# Patient Record
Sex: Female | Born: 1950
Health system: Southern US, Community
[De-identification: ages and names within clinical notes are randomized; demographics above are authoritative.]

## PROBLEM LIST (undated history)

## (undated) DIAGNOSIS — E119 Type 2 diabetes mellitus without complications: Secondary | ICD-10-CM

## (undated) DIAGNOSIS — K219 Gastro-esophageal reflux disease without esophagitis: Secondary | ICD-10-CM

## (undated) DIAGNOSIS — I10 Essential (primary) hypertension: Secondary | ICD-10-CM

## (undated) DIAGNOSIS — I639 Cerebral infarction, unspecified: Secondary | ICD-10-CM

## (undated) DIAGNOSIS — N182 Chronic kidney disease, stage 2 (mild): Secondary | ICD-10-CM

## (undated) DIAGNOSIS — E782 Mixed hyperlipidemia: Secondary | ICD-10-CM

## (undated) DIAGNOSIS — J45909 Unspecified asthma, uncomplicated: Secondary | ICD-10-CM

## (undated) DIAGNOSIS — Z8673 Personal history of transient ischemic attack (TIA), and cerebral infarction without residual deficits: Secondary | ICD-10-CM

## (undated) HISTORY — DX: Personal history of transient ischemic attack (TIA), and cerebral infarction without residual deficits: Z86.73

## (undated) HISTORY — DX: Essential (primary) hypertension: I10

## (undated) HISTORY — DX: Gastro-esophageal reflux disease without esophagitis: K21.9

## (undated) HISTORY — DX: Type 2 diabetes mellitus without complications: E11.9

## (undated) HISTORY — PX: ABDOMINAL HYSTERECTOMY: SUR658

## (undated) HISTORY — DX: Mixed hyperlipidemia: E78.2

## (undated) HISTORY — PX: TONSILLECTOMY: SUR1361

## (undated) HISTORY — DX: Chronic kidney disease, stage 2 (mild): N18.2

---

## 2005-08-04 ENCOUNTER — Encounter (INDEPENDENT_AMBULATORY_CARE_PROVIDER_SITE_OTHER): Payer: Self-pay | Admitting: Internal Medicine

## 2007-02-27 ENCOUNTER — Emergency Department (HOSPITAL_COMMUNITY): Admission: EM | Admit: 2007-02-27 | Discharge: 2007-02-27 | Payer: Self-pay | Admitting: Emergency Medicine

## 2007-02-27 LAB — CONVERTED CEMR LAB
BUN: 16 mg/dL
Eosinophils Relative: 4 %
Hemoglobin: 11.8 g/dL
Monocytes Relative: 6 %
Potassium: 4.1 meq/L
RBC: 4.19 M/uL
Sodium: 138 meq/L

## 2007-07-18 ENCOUNTER — Encounter (INDEPENDENT_AMBULATORY_CARE_PROVIDER_SITE_OTHER): Payer: Self-pay | Admitting: Internal Medicine

## 2007-07-18 ENCOUNTER — Ambulatory Visit (HOSPITAL_COMMUNITY): Admission: RE | Admit: 2007-07-18 | Discharge: 2007-07-18 | Payer: Self-pay | Admitting: Internal Medicine

## 2007-07-18 ENCOUNTER — Ambulatory Visit: Payer: Self-pay | Admitting: Internal Medicine

## 2007-07-18 ENCOUNTER — Other Ambulatory Visit: Admission: RE | Admit: 2007-07-18 | Discharge: 2007-07-18 | Payer: Self-pay | Admitting: Internal Medicine

## 2007-07-18 DIAGNOSIS — M129 Arthropathy, unspecified: Secondary | ICD-10-CM | POA: Insufficient documentation

## 2007-07-18 DIAGNOSIS — J45909 Unspecified asthma, uncomplicated: Secondary | ICD-10-CM | POA: Insufficient documentation

## 2007-07-18 DIAGNOSIS — M25569 Pain in unspecified knee: Secondary | ICD-10-CM | POA: Insufficient documentation

## 2007-07-18 DIAGNOSIS — I10 Essential (primary) hypertension: Secondary | ICD-10-CM | POA: Insufficient documentation

## 2007-07-18 LAB — CONVERTED CEMR LAB
Glucose, Urine, Semiquant: NEGATIVE
OCCULT 1: NEGATIVE
pH: 5

## 2007-07-19 ENCOUNTER — Telehealth (INDEPENDENT_AMBULATORY_CARE_PROVIDER_SITE_OTHER): Payer: Self-pay | Admitting: *Deleted

## 2007-07-19 ENCOUNTER — Encounter (INDEPENDENT_AMBULATORY_CARE_PROVIDER_SITE_OTHER): Payer: Self-pay | Admitting: Internal Medicine

## 2007-07-21 ENCOUNTER — Encounter (INDEPENDENT_AMBULATORY_CARE_PROVIDER_SITE_OTHER): Payer: Self-pay | Admitting: Internal Medicine

## 2007-07-22 ENCOUNTER — Encounter (INDEPENDENT_AMBULATORY_CARE_PROVIDER_SITE_OTHER): Payer: Self-pay | Admitting: Internal Medicine

## 2007-07-25 ENCOUNTER — Telehealth (INDEPENDENT_AMBULATORY_CARE_PROVIDER_SITE_OTHER): Payer: Self-pay | Admitting: *Deleted

## 2007-07-26 ENCOUNTER — Encounter (INDEPENDENT_AMBULATORY_CARE_PROVIDER_SITE_OTHER): Payer: Self-pay | Admitting: Internal Medicine

## 2007-08-01 ENCOUNTER — Ambulatory Visit: Payer: Self-pay | Admitting: Internal Medicine

## 2007-08-01 DIAGNOSIS — R87610 Atypical squamous cells of undetermined significance on cytologic smear of cervix (ASC-US): Secondary | ICD-10-CM

## 2007-08-01 LAB — CONVERTED CEMR LAB: Hgb A1c MFr Bld: 7.6 %

## 2007-08-02 ENCOUNTER — Encounter (INDEPENDENT_AMBULATORY_CARE_PROVIDER_SITE_OTHER): Payer: Self-pay | Admitting: Internal Medicine

## 2007-08-02 ENCOUNTER — Telehealth (INDEPENDENT_AMBULATORY_CARE_PROVIDER_SITE_OTHER): Payer: Self-pay | Admitting: *Deleted

## 2007-08-02 LAB — CONVERTED CEMR LAB
Albumin: 4.2 g/dL (ref 3.5–5.2)
CO2: 21 meq/L (ref 19–32)
Calcium: 9.7 mg/dL (ref 8.4–10.5)
Chloride: 105 meq/L (ref 96–112)
Cholesterol: 146 mg/dL (ref 0–200)
Eosinophils Relative: 5 % (ref 0–5)
Glucose, Bld: 83 mg/dL (ref 70–99)
HCT: 36.3 % (ref 36.0–46.0)
Hemoglobin: 11.4 g/dL — ABNORMAL LOW (ref 12.0–15.0)
Lymphocytes Relative: 45 % (ref 12–46)
Lymphs Abs: 3.5 10*3/uL (ref 0.7–4.0)
Neutro Abs: 3.6 10*3/uL (ref 1.7–7.7)
Platelets: 362 10*3/uL (ref 150–400)
Sodium: 142 meq/L (ref 135–145)
Total Bilirubin: 0.2 mg/dL — ABNORMAL LOW (ref 0.3–1.2)
Total Protein: 7.8 g/dL (ref 6.0–8.3)
Triglycerides: 240 mg/dL — ABNORMAL HIGH (ref <150)
WBC: 7.8 10*3/uL (ref 4.0–10.5)

## 2007-08-04 ENCOUNTER — Encounter (INDEPENDENT_AMBULATORY_CARE_PROVIDER_SITE_OTHER): Payer: Self-pay | Admitting: Internal Medicine

## 2007-08-11 ENCOUNTER — Encounter (INDEPENDENT_AMBULATORY_CARE_PROVIDER_SITE_OTHER): Payer: Self-pay | Admitting: Internal Medicine

## 2007-08-23 ENCOUNTER — Telehealth (INDEPENDENT_AMBULATORY_CARE_PROVIDER_SITE_OTHER): Payer: Self-pay | Admitting: *Deleted

## 2007-09-22 ENCOUNTER — Ambulatory Visit: Payer: Self-pay | Admitting: Internal Medicine

## 2007-09-22 LAB — CONVERTED CEMR LAB
Bilirubin Urine: NEGATIVE
Blood in Urine, dipstick: NEGATIVE
Ketones, urine, test strip: NEGATIVE
Nitrite: NEGATIVE
Protein, U semiquant: NEGATIVE
Urobilinogen, UA: 0.2

## 2007-09-23 ENCOUNTER — Ambulatory Visit (HOSPITAL_COMMUNITY): Admission: RE | Admit: 2007-09-23 | Discharge: 2007-09-23 | Payer: Self-pay | Admitting: Internal Medicine

## 2007-09-26 ENCOUNTER — Telehealth (INDEPENDENT_AMBULATORY_CARE_PROVIDER_SITE_OTHER): Payer: Self-pay | Admitting: Internal Medicine

## 2007-09-26 ENCOUNTER — Encounter (INDEPENDENT_AMBULATORY_CARE_PROVIDER_SITE_OTHER): Payer: Self-pay | Admitting: Internal Medicine

## 2007-10-17 ENCOUNTER — Encounter (INDEPENDENT_AMBULATORY_CARE_PROVIDER_SITE_OTHER): Payer: Self-pay | Admitting: Internal Medicine

## 2007-10-17 ENCOUNTER — Other Ambulatory Visit: Admission: RE | Admit: 2007-10-17 | Discharge: 2007-10-17 | Payer: Self-pay

## 2007-10-17 ENCOUNTER — Ambulatory Visit: Payer: Self-pay | Admitting: Internal Medicine

## 2007-10-17 LAB — CONVERTED CEMR LAB
Bilirubin Urine: NEGATIVE
Blood in Urine, dipstick: NEGATIVE
Glucose, Urine, Semiquant: NEGATIVE
Nitrite: NEGATIVE
Specific Gravity, Urine: 1.02

## 2007-10-18 ENCOUNTER — Telehealth (INDEPENDENT_AMBULATORY_CARE_PROVIDER_SITE_OTHER): Payer: Self-pay | Admitting: *Deleted

## 2007-10-18 ENCOUNTER — Encounter (INDEPENDENT_AMBULATORY_CARE_PROVIDER_SITE_OTHER): Payer: Self-pay | Admitting: Internal Medicine

## 2007-10-18 LAB — CONVERTED CEMR LAB
AST: 13 units/L (ref 0–37)
Albumin: 4 g/dL (ref 3.5–5.2)
Alkaline Phosphatase: 71 units/L (ref 39–117)
BUN: 18 mg/dL (ref 6–23)
Basophils Relative: 1 % (ref 0–1)
Creatinine, Ser: 0.98 mg/dL (ref 0.40–1.20)
Eosinophils Absolute: 0.4 10*3/uL (ref 0.0–0.7)
MCHC: 32.6 g/dL (ref 30.0–36.0)
MCV: 84.7 fL (ref 78.0–100.0)
Monocytes Relative: 4 % (ref 3–12)
Neutrophils Relative %: 49 % (ref 43–77)
Potassium: 3.9 meq/L (ref 3.5–5.3)
RBC: 4.31 M/uL (ref 3.87–5.11)

## 2007-10-25 ENCOUNTER — Telehealth (INDEPENDENT_AMBULATORY_CARE_PROVIDER_SITE_OTHER): Payer: Self-pay | Admitting: *Deleted

## 2007-10-25 ENCOUNTER — Encounter (INDEPENDENT_AMBULATORY_CARE_PROVIDER_SITE_OTHER): Payer: Self-pay | Admitting: Internal Medicine

## 2007-10-25 LAB — CONVERTED CEMR LAB: Pap Smear: ABNORMAL

## 2007-11-03 ENCOUNTER — Encounter (INDEPENDENT_AMBULATORY_CARE_PROVIDER_SITE_OTHER): Payer: Self-pay | Admitting: Internal Medicine

## 2007-12-05 ENCOUNTER — Encounter (INDEPENDENT_AMBULATORY_CARE_PROVIDER_SITE_OTHER): Payer: Self-pay | Admitting: Internal Medicine

## 2007-12-26 ENCOUNTER — Telehealth (INDEPENDENT_AMBULATORY_CARE_PROVIDER_SITE_OTHER): Payer: Self-pay | Admitting: *Deleted

## 2008-01-03 ENCOUNTER — Ambulatory Visit: Payer: Self-pay | Admitting: Internal Medicine

## 2008-03-05 ENCOUNTER — Telehealth (INDEPENDENT_AMBULATORY_CARE_PROVIDER_SITE_OTHER): Payer: Self-pay | Admitting: *Deleted

## 2008-04-23 ENCOUNTER — Telehealth (INDEPENDENT_AMBULATORY_CARE_PROVIDER_SITE_OTHER): Payer: Self-pay | Admitting: Internal Medicine

## 2008-06-05 ENCOUNTER — Ambulatory Visit (HOSPITAL_COMMUNITY): Admission: RE | Admit: 2008-06-05 | Discharge: 2008-06-05 | Payer: Self-pay | Admitting: Internal Medicine

## 2008-06-05 ENCOUNTER — Ambulatory Visit: Payer: Self-pay | Admitting: Internal Medicine

## 2008-06-05 DIAGNOSIS — R209 Unspecified disturbances of skin sensation: Secondary | ICD-10-CM | POA: Insufficient documentation

## 2008-06-05 LAB — CONVERTED CEMR LAB
Blood Glucose, Fingerstick: 190
Hgb A1c MFr Bld: 8.5 %

## 2008-06-06 ENCOUNTER — Telehealth (INDEPENDENT_AMBULATORY_CARE_PROVIDER_SITE_OTHER): Payer: Self-pay | Admitting: Internal Medicine

## 2008-06-08 ENCOUNTER — Telehealth (INDEPENDENT_AMBULATORY_CARE_PROVIDER_SITE_OTHER): Payer: Self-pay | Admitting: *Deleted

## 2008-06-12 ENCOUNTER — Encounter (INDEPENDENT_AMBULATORY_CARE_PROVIDER_SITE_OTHER): Payer: Self-pay | Admitting: Internal Medicine

## 2008-06-21 ENCOUNTER — Ambulatory Visit: Payer: Self-pay | Admitting: Cardiology

## 2008-07-02 ENCOUNTER — Ambulatory Visit: Payer: Self-pay | Admitting: Internal Medicine

## 2008-07-16 ENCOUNTER — Telehealth (INDEPENDENT_AMBULATORY_CARE_PROVIDER_SITE_OTHER): Payer: Self-pay | Admitting: Internal Medicine

## 2008-07-18 ENCOUNTER — Ambulatory Visit: Payer: Self-pay | Admitting: Internal Medicine

## 2008-07-20 ENCOUNTER — Telehealth (INDEPENDENT_AMBULATORY_CARE_PROVIDER_SITE_OTHER): Payer: Self-pay | Admitting: *Deleted

## 2008-08-07 ENCOUNTER — Telehealth (INDEPENDENT_AMBULATORY_CARE_PROVIDER_SITE_OTHER): Payer: Self-pay | Admitting: *Deleted

## 2008-08-09 ENCOUNTER — Encounter (INDEPENDENT_AMBULATORY_CARE_PROVIDER_SITE_OTHER): Payer: Self-pay | Admitting: Internal Medicine

## 2008-08-30 ENCOUNTER — Ambulatory Visit: Payer: Self-pay | Admitting: Internal Medicine

## 2008-08-30 DIAGNOSIS — M25519 Pain in unspecified shoulder: Secondary | ICD-10-CM | POA: Insufficient documentation

## 2008-09-12 ENCOUNTER — Ambulatory Visit: Payer: Self-pay | Admitting: Internal Medicine

## 2008-09-12 DIAGNOSIS — E1159 Type 2 diabetes mellitus with other circulatory complications: Secondary | ICD-10-CM

## 2008-09-12 DIAGNOSIS — Z8673 Personal history of transient ischemic attack (TIA), and cerebral infarction without residual deficits: Secondary | ICD-10-CM | POA: Insufficient documentation

## 2008-09-12 DIAGNOSIS — I6350 Cerebral infarction due to unspecified occlusion or stenosis of unspecified cerebral artery: Secondary | ICD-10-CM | POA: Insufficient documentation

## 2008-09-12 LAB — CONVERTED CEMR LAB: Hgb A1c MFr Bld: 7.5 %

## 2008-09-13 ENCOUNTER — Encounter (INDEPENDENT_AMBULATORY_CARE_PROVIDER_SITE_OTHER): Payer: Self-pay | Admitting: Internal Medicine

## 2008-09-13 LAB — CONVERTED CEMR LAB
Calcium: 9.9 mg/dL (ref 8.4–10.5)
Cholesterol: 167 mg/dL (ref 0–200)
Glucose, Bld: 74 mg/dL (ref 70–99)
HDL: 30 mg/dL — ABNORMAL LOW (ref >39)
Microalb Creat Ratio: 2.9 mg/g (ref 0.0–30.0)
Microalb, Ur: 0.72 mg/dL (ref 0.00–1.89)
Sodium: 140 meq/L (ref 135–145)
Total CHOL/HDL Ratio: 5.6

## 2008-10-23 ENCOUNTER — Telehealth (INDEPENDENT_AMBULATORY_CARE_PROVIDER_SITE_OTHER): Payer: Self-pay | Admitting: Internal Medicine

## 2008-12-12 ENCOUNTER — Ambulatory Visit: Payer: Self-pay | Admitting: Internal Medicine

## 2008-12-12 LAB — CONVERTED CEMR LAB: Hgb A1c MFr Bld: 7.9 %

## 2008-12-31 ENCOUNTER — Encounter (INDEPENDENT_AMBULATORY_CARE_PROVIDER_SITE_OTHER): Payer: Self-pay | Admitting: Internal Medicine

## 2008-12-31 ENCOUNTER — Telehealth (INDEPENDENT_AMBULATORY_CARE_PROVIDER_SITE_OTHER): Payer: Self-pay | Admitting: *Deleted

## 2009-01-18 ENCOUNTER — Telehealth (INDEPENDENT_AMBULATORY_CARE_PROVIDER_SITE_OTHER): Payer: Self-pay | Admitting: *Deleted

## 2010-07-20 LAB — CONVERTED CEMR LAB
Alkaline Phosphatase: 60 units/L (ref 39–117)
BUN: 18 mg/dL (ref 6–23)
Blood Glucose, Fingerstick: 136
Creatinine, Ser: 0.97 mg/dL (ref 0.40–1.20)
Glucose, Bld: 87 mg/dL (ref 70–99)
Hemoglobin: 12.1 g/dL
Total Bilirubin: 0.3 mg/dL (ref 0.3–1.2)

## 2010-12-03 ENCOUNTER — Ambulatory Visit: Payer: Medicare Other | Attending: Neurology

## 2010-12-03 DIAGNOSIS — G4710 Hypersomnia, unspecified: Secondary | ICD-10-CM | POA: Insufficient documentation

## 2010-12-03 DIAGNOSIS — Z6836 Body mass index (BMI) 36.0-36.9, adult: Secondary | ICD-10-CM | POA: Insufficient documentation

## 2010-12-03 DIAGNOSIS — G4730 Sleep apnea, unspecified: Secondary | ICD-10-CM | POA: Insufficient documentation

## 2010-12-06 NOTE — Procedures (Signed)
Katherine Stokes, Katherine Stokes              ACCOUNT NO.:  [id number]  MEDICAL RECORD NO.:  [id number]          PATIENT TYPE:  OUT  LOCATION:  SLEEP LAB                     FACILITY:  [facility]  PHYSICIAN:  Bob Eastwood A. Merlene Laughter, M.D. DATE OF BIRTH:  08-10-1950  DATE OF STUDY:  12/03/2010                           NOCTURNAL POLYSOMNOGRAM  REFERRING PHYSICIAN:  Jasmeen Fritsch  REFERRING PHYSICIAN:  Kaye Luoma A. Merlene Laughter, MD  INDICATION FOR STUDY:  This is a 60 year old who presents with fatigue, hypersomnia, and snoring.  The study has been done to evaluate for obstructive sleep apnea syndrome.  EPWORTH SLEEPINESS SCORE: 1. BMI 36.  MEDICATIONS:  Plavix, metformin, benazepril, Norvasc, folic acid, Protonix, TriCor, and Glucotrol.  SLEEP ARCHITECTURE:  Architectural summary:  The total recording time is 401 minutes.  Sleep efficiency 75.8%, sleep latency 21.5 minutes, REM latency 62 minutes.  Stage N1 12.5%, N2 56.6%, N3 6.7%, and REM sleep 24.2%.  RESPIRATORY DATA:  Respiratory summary:  Baseline oxygen saturation is 99, lowest saturation 89 with REM sleep.  Diagnostic AHI 3 and RDI 7.  OXYGEN DATA:  CARDIAC DATA:  Electrocardiogram summary:  Average heart rate is 81 with no significant dysrhythmias observed.  MOVEMENT-PARASOMNIA:  Limb movement summary:  PLM index 0.  IMPRESSIONS-RECOMMENDATIONS:  Impression:  Unremarkable nocturnal polysomnography.     Alverna Fawley A. Merlene Laughter, M.D. Electronically Signed 12/07/2010 21:27:34   KAD/MEDQ  D:  12/05/2010 17:33:25  T:  12/06/2010 05:35:20  Job:  [id number]

## 2011-04-03 LAB — CBC
HCT: 35.9 — ABNORMAL LOW
Hemoglobin: 11.8 — ABNORMAL LOW
MCV: 85.8
Platelets: 330
WBC: 7.6

## 2011-04-03 LAB — DIFFERENTIAL
Eosinophils Absolute: 0.3
Eosinophils Relative: 4
Lymphs Abs: 3
Monocytes Absolute: 0.4
Monocytes Relative: 6

## 2011-04-03 LAB — BASIC METABOLIC PANEL
BUN: 16
Chloride: 107
Potassium: 4.1
Sodium: 138

## 2011-07-05 DIAGNOSIS — E119 Type 2 diabetes mellitus without complications: Secondary | ICD-10-CM | POA: Diagnosis not present

## 2011-07-05 DIAGNOSIS — Z8673 Personal history of transient ischemic attack (TIA), and cerebral infarction without residual deficits: Secondary | ICD-10-CM | POA: Diagnosis not present

## 2011-07-05 DIAGNOSIS — N390 Urinary tract infection, site not specified: Secondary | ICD-10-CM | POA: Diagnosis not present

## 2011-07-05 DIAGNOSIS — Z79899 Other long term (current) drug therapy: Secondary | ICD-10-CM | POA: Diagnosis not present

## 2011-07-05 DIAGNOSIS — Z794 Long term (current) use of insulin: Secondary | ICD-10-CM | POA: Diagnosis not present

## 2011-07-05 DIAGNOSIS — Z7902 Long term (current) use of antithrombotics/antiplatelets: Secondary | ICD-10-CM | POA: Diagnosis not present

## 2011-07-27 DIAGNOSIS — E782 Mixed hyperlipidemia: Secondary | ICD-10-CM | POA: Diagnosis not present

## 2011-07-27 DIAGNOSIS — I10 Essential (primary) hypertension: Secondary | ICD-10-CM | POA: Diagnosis not present

## 2011-07-27 DIAGNOSIS — G459 Transient cerebral ischemic attack, unspecified: Secondary | ICD-10-CM | POA: Diagnosis not present

## 2011-10-06 DIAGNOSIS — I6990 Unspecified sequelae of unspecified cerebrovascular disease: Secondary | ICD-10-CM | POA: Diagnosis not present

## 2011-10-06 DIAGNOSIS — R51 Headache: Secondary | ICD-10-CM | POA: Diagnosis not present

## 2011-10-06 DIAGNOSIS — G250 Essential tremor: Secondary | ICD-10-CM | POA: Diagnosis not present

## 2011-10-06 DIAGNOSIS — M62838 Other muscle spasm: Secondary | ICD-10-CM | POA: Diagnosis not present

## 2011-10-16 DIAGNOSIS — R569 Unspecified convulsions: Secondary | ICD-10-CM | POA: Diagnosis not present

## 2011-10-22 DIAGNOSIS — Z8673 Personal history of transient ischemic attack (TIA), and cerebral infarction without residual deficits: Secondary | ICD-10-CM | POA: Diagnosis not present

## 2011-10-22 DIAGNOSIS — Z87440 Personal history of urinary (tract) infections: Secondary | ICD-10-CM | POA: Diagnosis not present

## 2011-10-22 DIAGNOSIS — Z79899 Other long term (current) drug therapy: Secondary | ICD-10-CM | POA: Diagnosis not present

## 2011-10-22 DIAGNOSIS — Z794 Long term (current) use of insulin: Secondary | ICD-10-CM | POA: Diagnosis not present

## 2011-10-22 DIAGNOSIS — B373 Candidiasis of vulva and vagina: Secondary | ICD-10-CM | POA: Diagnosis not present

## 2011-10-22 DIAGNOSIS — R300 Dysuria: Secondary | ICD-10-CM | POA: Diagnosis not present

## 2011-10-22 DIAGNOSIS — E119 Type 2 diabetes mellitus without complications: Secondary | ICD-10-CM | POA: Diagnosis not present

## 2011-10-22 DIAGNOSIS — K219 Gastro-esophageal reflux disease without esophagitis: Secondary | ICD-10-CM | POA: Diagnosis not present

## 2011-11-05 DIAGNOSIS — M62838 Other muscle spasm: Secondary | ICD-10-CM | POA: Diagnosis not present

## 2011-11-05 DIAGNOSIS — I6990 Unspecified sequelae of unspecified cerebrovascular disease: Secondary | ICD-10-CM | POA: Diagnosis not present

## 2011-11-05 DIAGNOSIS — I10 Essential (primary) hypertension: Secondary | ICD-10-CM | POA: Diagnosis not present

## 2012-01-28 DIAGNOSIS — E782 Mixed hyperlipidemia: Secondary | ICD-10-CM | POA: Diagnosis not present

## 2012-01-28 DIAGNOSIS — I10 Essential (primary) hypertension: Secondary | ICD-10-CM | POA: Diagnosis not present

## 2012-06-27 DIAGNOSIS — E782 Mixed hyperlipidemia: Secondary | ICD-10-CM | POA: Diagnosis not present

## 2012-06-27 DIAGNOSIS — I10 Essential (primary) hypertension: Secondary | ICD-10-CM | POA: Diagnosis not present

## 2012-07-03 DIAGNOSIS — Z880 Allergy status to penicillin: Secondary | ICD-10-CM | POA: Diagnosis not present

## 2012-07-03 DIAGNOSIS — Z79899 Other long term (current) drug therapy: Secondary | ICD-10-CM | POA: Diagnosis not present

## 2012-07-03 DIAGNOSIS — E119 Type 2 diabetes mellitus without complications: Secondary | ICD-10-CM | POA: Diagnosis not present

## 2012-07-03 DIAGNOSIS — N760 Acute vaginitis: Secondary | ICD-10-CM | POA: Diagnosis not present

## 2012-07-03 DIAGNOSIS — Z794 Long term (current) use of insulin: Secondary | ICD-10-CM | POA: Diagnosis not present

## 2012-07-26 DIAGNOSIS — I10 Essential (primary) hypertension: Secondary | ICD-10-CM | POA: Diagnosis not present

## 2012-07-26 DIAGNOSIS — I6990 Unspecified sequelae of unspecified cerebrovascular disease: Secondary | ICD-10-CM | POA: Diagnosis not present

## 2012-07-26 DIAGNOSIS — Z79899 Other long term (current) drug therapy: Secondary | ICD-10-CM | POA: Diagnosis not present

## 2012-07-26 DIAGNOSIS — R209 Unspecified disturbances of skin sensation: Secondary | ICD-10-CM | POA: Diagnosis not present

## 2012-09-19 DIAGNOSIS — I10 Essential (primary) hypertension: Secondary | ICD-10-CM | POA: Diagnosis not present

## 2012-10-05 DIAGNOSIS — Z1211 Encounter for screening for malignant neoplasm of colon: Secondary | ICD-10-CM | POA: Diagnosis not present

## 2012-10-13 DIAGNOSIS — E669 Obesity, unspecified: Secondary | ICD-10-CM | POA: Diagnosis not present

## 2012-10-13 DIAGNOSIS — E109 Type 1 diabetes mellitus without complications: Secondary | ICD-10-CM | POA: Diagnosis not present

## 2012-10-13 DIAGNOSIS — Z794 Long term (current) use of insulin: Secondary | ICD-10-CM | POA: Diagnosis not present

## 2012-10-13 DIAGNOSIS — Z803 Family history of malignant neoplasm of breast: Secondary | ICD-10-CM | POA: Diagnosis not present

## 2012-10-13 DIAGNOSIS — I10 Essential (primary) hypertension: Secondary | ICD-10-CM | POA: Diagnosis not present

## 2012-10-13 DIAGNOSIS — K219 Gastro-esophageal reflux disease without esophagitis: Secondary | ICD-10-CM | POA: Diagnosis not present

## 2012-10-13 DIAGNOSIS — Z79899 Other long term (current) drug therapy: Secondary | ICD-10-CM | POA: Diagnosis not present

## 2012-10-13 DIAGNOSIS — Z7902 Long term (current) use of antithrombotics/antiplatelets: Secondary | ICD-10-CM | POA: Diagnosis not present

## 2012-10-13 DIAGNOSIS — Z8673 Personal history of transient ischemic attack (TIA), and cerebral infarction without residual deficits: Secondary | ICD-10-CM | POA: Diagnosis not present

## 2012-10-13 DIAGNOSIS — Z1211 Encounter for screening for malignant neoplasm of colon: Secondary | ICD-10-CM | POA: Diagnosis not present

## 2012-10-13 DIAGNOSIS — Z6836 Body mass index (BMI) 36.0-36.9, adult: Secondary | ICD-10-CM | POA: Diagnosis not present

## 2012-10-13 DIAGNOSIS — Z880 Allergy status to penicillin: Secondary | ICD-10-CM | POA: Diagnosis not present

## 2012-10-13 DIAGNOSIS — E785 Hyperlipidemia, unspecified: Secondary | ICD-10-CM | POA: Diagnosis not present

## 2012-10-25 DIAGNOSIS — G4700 Insomnia, unspecified: Secondary | ICD-10-CM | POA: Diagnosis not present

## 2012-10-25 DIAGNOSIS — I6990 Unspecified sequelae of unspecified cerebrovascular disease: Secondary | ICD-10-CM | POA: Diagnosis not present

## 2012-10-25 DIAGNOSIS — R1110 Vomiting, unspecified: Secondary | ICD-10-CM | POA: Diagnosis not present

## 2012-11-15 DIAGNOSIS — N760 Acute vaginitis: Secondary | ICD-10-CM | POA: Diagnosis not present

## 2012-11-17 DIAGNOSIS — R109 Unspecified abdominal pain: Secondary | ICD-10-CM | POA: Diagnosis not present

## 2012-11-17 DIAGNOSIS — Z794 Long term (current) use of insulin: Secondary | ICD-10-CM | POA: Diagnosis not present

## 2012-11-17 DIAGNOSIS — Z79899 Other long term (current) drug therapy: Secondary | ICD-10-CM | POA: Diagnosis not present

## 2012-11-17 DIAGNOSIS — E119 Type 2 diabetes mellitus without complications: Secondary | ICD-10-CM | POA: Diagnosis not present

## 2012-11-17 DIAGNOSIS — L738 Other specified follicular disorders: Secondary | ICD-10-CM | POA: Diagnosis not present

## 2012-11-17 DIAGNOSIS — Z1629 Resistance to other single specified antibiotic: Secondary | ICD-10-CM | POA: Diagnosis not present

## 2012-12-19 DIAGNOSIS — N764 Abscess of vulva: Secondary | ICD-10-CM | POA: Diagnosis not present

## 2012-12-19 DIAGNOSIS — Z794 Long term (current) use of insulin: Secondary | ICD-10-CM | POA: Diagnosis not present

## 2012-12-19 DIAGNOSIS — E119 Type 2 diabetes mellitus without complications: Secondary | ICD-10-CM | POA: Diagnosis not present

## 2012-12-19 DIAGNOSIS — Z79899 Other long term (current) drug therapy: Secondary | ICD-10-CM | POA: Diagnosis not present

## 2012-12-22 DIAGNOSIS — Z4800 Encounter for change or removal of nonsurgical wound dressing: Secondary | ICD-10-CM | POA: Diagnosis not present

## 2012-12-22 DIAGNOSIS — N764 Abscess of vulva: Secondary | ICD-10-CM | POA: Diagnosis not present

## 2013-01-30 DIAGNOSIS — Z79899 Other long term (current) drug therapy: Secondary | ICD-10-CM | POA: Diagnosis not present

## 2013-01-30 DIAGNOSIS — M159 Polyosteoarthritis, unspecified: Secondary | ICD-10-CM | POA: Diagnosis not present

## 2013-01-30 DIAGNOSIS — G609 Hereditary and idiopathic neuropathy, unspecified: Secondary | ICD-10-CM | POA: Diagnosis not present

## 2013-01-30 DIAGNOSIS — I6990 Unspecified sequelae of unspecified cerebrovascular disease: Secondary | ICD-10-CM | POA: Diagnosis not present

## 2013-03-20 DIAGNOSIS — K029 Dental caries, unspecified: Secondary | ICD-10-CM | POA: Diagnosis not present

## 2013-03-20 DIAGNOSIS — E785 Hyperlipidemia, unspecified: Secondary | ICD-10-CM | POA: Diagnosis not present

## 2013-03-20 DIAGNOSIS — Z794 Long term (current) use of insulin: Secondary | ICD-10-CM | POA: Diagnosis not present

## 2013-03-20 DIAGNOSIS — Z79899 Other long term (current) drug therapy: Secondary | ICD-10-CM | POA: Diagnosis not present

## 2013-03-20 DIAGNOSIS — Z8673 Personal history of transient ischemic attack (TIA), and cerebral infarction without residual deficits: Secondary | ICD-10-CM | POA: Diagnosis not present

## 2013-03-20 DIAGNOSIS — K219 Gastro-esophageal reflux disease without esophagitis: Secondary | ICD-10-CM | POA: Diagnosis not present

## 2013-03-20 DIAGNOSIS — E119 Type 2 diabetes mellitus without complications: Secondary | ICD-10-CM | POA: Diagnosis not present

## 2013-05-01 DIAGNOSIS — I6990 Unspecified sequelae of unspecified cerebrovascular disease: Secondary | ICD-10-CM | POA: Diagnosis not present

## 2013-05-01 DIAGNOSIS — G4700 Insomnia, unspecified: Secondary | ICD-10-CM | POA: Diagnosis not present

## 2013-05-01 DIAGNOSIS — G609 Hereditary and idiopathic neuropathy, unspecified: Secondary | ICD-10-CM | POA: Diagnosis not present

## 2013-05-01 DIAGNOSIS — M159 Polyosteoarthritis, unspecified: Secondary | ICD-10-CM | POA: Diagnosis not present

## 2013-05-08 DIAGNOSIS — L02818 Cutaneous abscess of other sites: Secondary | ICD-10-CM | POA: Diagnosis not present

## 2013-06-01 DIAGNOSIS — Z0000 Encounter for general adult medical examination without abnormal findings: Secondary | ICD-10-CM | POA: Diagnosis not present

## 2013-06-01 DIAGNOSIS — E782 Mixed hyperlipidemia: Secondary | ICD-10-CM | POA: Diagnosis not present

## 2013-06-01 DIAGNOSIS — I10 Essential (primary) hypertension: Secondary | ICD-10-CM | POA: Diagnosis not present

## 2013-08-03 DIAGNOSIS — I10 Essential (primary) hypertension: Secondary | ICD-10-CM | POA: Diagnosis not present

## 2013-08-03 DIAGNOSIS — IMO0001 Reserved for inherently not codable concepts without codable children: Secondary | ICD-10-CM | POA: Diagnosis not present

## 2013-09-01 DIAGNOSIS — E785 Hyperlipidemia, unspecified: Secondary | ICD-10-CM | POA: Diagnosis not present

## 2013-09-01 DIAGNOSIS — Z79899 Other long term (current) drug therapy: Secondary | ICD-10-CM | POA: Diagnosis not present

## 2013-09-01 DIAGNOSIS — Z8673 Personal history of transient ischemic attack (TIA), and cerebral infarction without residual deficits: Secondary | ICD-10-CM | POA: Diagnosis not present

## 2013-09-01 DIAGNOSIS — R112 Nausea with vomiting, unspecified: Secondary | ICD-10-CM | POA: Diagnosis not present

## 2013-09-01 DIAGNOSIS — R197 Diarrhea, unspecified: Secondary | ICD-10-CM | POA: Diagnosis not present

## 2013-09-01 DIAGNOSIS — E119 Type 2 diabetes mellitus without complications: Secondary | ICD-10-CM | POA: Diagnosis not present

## 2013-09-01 DIAGNOSIS — K219 Gastro-esophageal reflux disease without esophagitis: Secondary | ICD-10-CM | POA: Diagnosis not present

## 2013-09-07 DIAGNOSIS — E1169 Type 2 diabetes mellitus with other specified complication: Secondary | ICD-10-CM | POA: Diagnosis not present

## 2013-09-07 DIAGNOSIS — K219 Gastro-esophageal reflux disease without esophagitis: Secondary | ICD-10-CM | POA: Diagnosis not present

## 2013-09-07 DIAGNOSIS — Z79899 Other long term (current) drug therapy: Secondary | ICD-10-CM | POA: Diagnosis not present

## 2013-09-07 DIAGNOSIS — Z794 Long term (current) use of insulin: Secondary | ICD-10-CM | POA: Diagnosis not present

## 2013-09-07 DIAGNOSIS — Z7902 Long term (current) use of antithrombotics/antiplatelets: Secondary | ICD-10-CM | POA: Diagnosis not present

## 2013-09-07 DIAGNOSIS — Z8673 Personal history of transient ischemic attack (TIA), and cerebral infarction without residual deficits: Secondary | ICD-10-CM | POA: Diagnosis not present

## 2013-09-07 DIAGNOSIS — Z833 Family history of diabetes mellitus: Secondary | ICD-10-CM | POA: Diagnosis not present

## 2013-09-07 DIAGNOSIS — E119 Type 2 diabetes mellitus without complications: Secondary | ICD-10-CM | POA: Diagnosis not present

## 2013-09-07 DIAGNOSIS — E785 Hyperlipidemia, unspecified: Secondary | ICD-10-CM | POA: Diagnosis not present

## 2013-09-11 DIAGNOSIS — N19 Unspecified kidney failure: Secondary | ICD-10-CM | POA: Diagnosis not present

## 2013-09-11 DIAGNOSIS — IMO0001 Reserved for inherently not codable concepts without codable children: Secondary | ICD-10-CM | POA: Diagnosis not present

## 2013-10-28 DIAGNOSIS — K219 Gastro-esophageal reflux disease without esophagitis: Secondary | ICD-10-CM | POA: Diagnosis not present

## 2013-10-28 DIAGNOSIS — E119 Type 2 diabetes mellitus without complications: Secondary | ICD-10-CM | POA: Diagnosis not present

## 2013-10-28 DIAGNOSIS — Z79899 Other long term (current) drug therapy: Secondary | ICD-10-CM | POA: Diagnosis not present

## 2013-10-28 DIAGNOSIS — K649 Unspecified hemorrhoids: Secondary | ICD-10-CM | POA: Diagnosis not present

## 2013-10-28 DIAGNOSIS — Z794 Long term (current) use of insulin: Secondary | ICD-10-CM | POA: Diagnosis not present

## 2013-10-30 DIAGNOSIS — I6990 Unspecified sequelae of unspecified cerebrovascular disease: Secondary | ICD-10-CM | POA: Diagnosis not present

## 2013-10-30 DIAGNOSIS — G4700 Insomnia, unspecified: Secondary | ICD-10-CM | POA: Diagnosis not present

## 2013-10-30 DIAGNOSIS — M159 Polyosteoarthritis, unspecified: Secondary | ICD-10-CM | POA: Diagnosis not present

## 2013-10-30 DIAGNOSIS — G609 Hereditary and idiopathic neuropathy, unspecified: Secondary | ICD-10-CM | POA: Diagnosis not present

## 2013-11-01 DIAGNOSIS — IMO0001 Reserved for inherently not codable concepts without codable children: Secondary | ICD-10-CM | POA: Diagnosis not present

## 2014-03-29 DIAGNOSIS — E119 Type 2 diabetes mellitus without complications: Secondary | ICD-10-CM | POA: Diagnosis not present

## 2014-03-29 DIAGNOSIS — I10 Essential (primary) hypertension: Secondary | ICD-10-CM | POA: Diagnosis not present

## 2014-07-06 DIAGNOSIS — Z1389 Encounter for screening for other disorder: Secondary | ICD-10-CM | POA: Diagnosis not present

## 2014-07-06 DIAGNOSIS — Z0000 Encounter for general adult medical examination without abnormal findings: Secondary | ICD-10-CM | POA: Diagnosis not present

## 2014-07-06 DIAGNOSIS — I10 Essential (primary) hypertension: Secondary | ICD-10-CM | POA: Diagnosis not present

## 2014-07-06 DIAGNOSIS — E119 Type 2 diabetes mellitus without complications: Secondary | ICD-10-CM | POA: Diagnosis not present

## 2014-07-30 DIAGNOSIS — H2513 Age-related nuclear cataract, bilateral: Secondary | ICD-10-CM | POA: Diagnosis not present

## 2014-07-30 DIAGNOSIS — E119 Type 2 diabetes mellitus without complications: Secondary | ICD-10-CM | POA: Diagnosis not present

## 2014-07-31 DIAGNOSIS — M159 Polyosteoarthritis, unspecified: Secondary | ICD-10-CM | POA: Diagnosis not present

## 2014-07-31 DIAGNOSIS — I6990 Unspecified sequelae of unspecified cerebrovascular disease: Secondary | ICD-10-CM | POA: Diagnosis not present

## 2014-07-31 DIAGNOSIS — G4701 Insomnia due to medical condition: Secondary | ICD-10-CM | POA: Diagnosis not present

## 2014-07-31 DIAGNOSIS — M792 Neuralgia and neuritis, unspecified: Secondary | ICD-10-CM | POA: Diagnosis not present

## 2014-08-12 DIAGNOSIS — H1031 Unspecified acute conjunctivitis, right eye: Secondary | ICD-10-CM | POA: Diagnosis not present

## 2014-08-12 DIAGNOSIS — H5711 Ocular pain, right eye: Secondary | ICD-10-CM | POA: Diagnosis not present

## 2014-08-17 DIAGNOSIS — Z794 Long term (current) use of insulin: Secondary | ICD-10-CM | POA: Diagnosis not present

## 2014-08-17 DIAGNOSIS — N289 Disorder of kidney and ureter, unspecified: Secondary | ICD-10-CM | POA: Diagnosis not present

## 2014-08-17 DIAGNOSIS — Z7982 Long term (current) use of aspirin: Secondary | ICD-10-CM | POA: Diagnosis not present

## 2014-08-17 DIAGNOSIS — Z880 Allergy status to penicillin: Secondary | ICD-10-CM | POA: Diagnosis not present

## 2014-08-17 DIAGNOSIS — R509 Fever, unspecified: Secondary | ICD-10-CM | POA: Diagnosis not present

## 2014-08-17 DIAGNOSIS — B9689 Other specified bacterial agents as the cause of diseases classified elsewhere: Secondary | ICD-10-CM | POA: Diagnosis not present

## 2014-08-17 DIAGNOSIS — K449 Diaphragmatic hernia without obstruction or gangrene: Secondary | ICD-10-CM | POA: Diagnosis not present

## 2014-08-17 DIAGNOSIS — E860 Dehydration: Secondary | ICD-10-CM | POA: Diagnosis not present

## 2014-08-17 DIAGNOSIS — N179 Acute kidney failure, unspecified: Secondary | ICD-10-CM | POA: Diagnosis not present

## 2014-08-17 DIAGNOSIS — Z79899 Other long term (current) drug therapy: Secondary | ICD-10-CM | POA: Diagnosis not present

## 2014-08-17 DIAGNOSIS — R0602 Shortness of breath: Secondary | ICD-10-CM | POA: Diagnosis not present

## 2014-08-17 DIAGNOSIS — I10 Essential (primary) hypertension: Secondary | ICD-10-CM | POA: Diagnosis not present

## 2014-08-17 DIAGNOSIS — Z882 Allergy status to sulfonamides status: Secondary | ICD-10-CM | POA: Diagnosis not present

## 2014-08-17 DIAGNOSIS — N959 Unspecified menopausal and perimenopausal disorder: Secondary | ICD-10-CM | POA: Diagnosis not present

## 2014-08-17 DIAGNOSIS — E785 Hyperlipidemia, unspecified: Secondary | ICD-10-CM | POA: Diagnosis not present

## 2014-08-17 DIAGNOSIS — J40 Bronchitis, not specified as acute or chronic: Secondary | ICD-10-CM | POA: Diagnosis not present

## 2014-08-17 DIAGNOSIS — B9789 Other viral agents as the cause of diseases classified elsewhere: Secondary | ICD-10-CM | POA: Diagnosis not present

## 2014-08-17 DIAGNOSIS — E119 Type 2 diabetes mellitus without complications: Secondary | ICD-10-CM | POA: Diagnosis not present

## 2014-08-17 DIAGNOSIS — N390 Urinary tract infection, site not specified: Secondary | ICD-10-CM | POA: Diagnosis not present

## 2014-08-17 DIAGNOSIS — R51 Headache: Secondary | ICD-10-CM | POA: Diagnosis not present

## 2014-08-17 DIAGNOSIS — R531 Weakness: Secondary | ICD-10-CM | POA: Diagnosis not present

## 2014-08-18 DIAGNOSIS — E785 Hyperlipidemia, unspecified: Secondary | ICD-10-CM | POA: Diagnosis not present

## 2014-08-18 DIAGNOSIS — N390 Urinary tract infection, site not specified: Secondary | ICD-10-CM | POA: Diagnosis not present

## 2014-08-18 DIAGNOSIS — E119 Type 2 diabetes mellitus without complications: Secondary | ICD-10-CM | POA: Diagnosis not present

## 2014-08-18 DIAGNOSIS — N179 Acute kidney failure, unspecified: Secondary | ICD-10-CM | POA: Diagnosis not present

## 2014-08-18 DIAGNOSIS — E860 Dehydration: Secondary | ICD-10-CM | POA: Diagnosis not present

## 2014-08-20 DIAGNOSIS — N390 Urinary tract infection, site not specified: Secondary | ICD-10-CM | POA: Diagnosis not present

## 2014-08-20 DIAGNOSIS — E785 Hyperlipidemia, unspecified: Secondary | ICD-10-CM | POA: Diagnosis not present

## 2014-08-20 DIAGNOSIS — N179 Acute kidney failure, unspecified: Secondary | ICD-10-CM | POA: Diagnosis not present

## 2014-08-20 DIAGNOSIS — E860 Dehydration: Secondary | ICD-10-CM | POA: Diagnosis not present

## 2014-08-20 DIAGNOSIS — E119 Type 2 diabetes mellitus without complications: Secondary | ICD-10-CM | POA: Diagnosis not present

## 2014-08-28 DIAGNOSIS — H40059 Ocular hypertension, unspecified eye: Secondary | ICD-10-CM | POA: Diagnosis not present

## 2014-09-12 DIAGNOSIS — M109 Gout, unspecified: Secondary | ICD-10-CM | POA: Diagnosis not present

## 2014-09-12 DIAGNOSIS — E1165 Type 2 diabetes mellitus with hyperglycemia: Secondary | ICD-10-CM | POA: Diagnosis not present

## 2014-09-12 DIAGNOSIS — I10 Essential (primary) hypertension: Secondary | ICD-10-CM | POA: Diagnosis not present

## 2014-09-13 DIAGNOSIS — Z1231 Encounter for screening mammogram for malignant neoplasm of breast: Secondary | ICD-10-CM | POA: Diagnosis not present

## 2015-01-31 DIAGNOSIS — E1165 Type 2 diabetes mellitus with hyperglycemia: Secondary | ICD-10-CM | POA: Diagnosis not present

## 2015-01-31 DIAGNOSIS — I10 Essential (primary) hypertension: Secondary | ICD-10-CM | POA: Diagnosis not present

## 2015-05-06 DIAGNOSIS — E1165 Type 2 diabetes mellitus with hyperglycemia: Secondary | ICD-10-CM | POA: Diagnosis not present

## 2015-05-06 DIAGNOSIS — E1121 Type 2 diabetes mellitus with diabetic nephropathy: Secondary | ICD-10-CM | POA: Diagnosis not present

## 2015-05-06 DIAGNOSIS — I10 Essential (primary) hypertension: Secondary | ICD-10-CM | POA: Diagnosis not present

## 2015-05-07 DIAGNOSIS — M159 Polyosteoarthritis, unspecified: Secondary | ICD-10-CM | POA: Diagnosis not present

## 2015-05-07 DIAGNOSIS — I6990 Unspecified sequelae of unspecified cerebrovascular disease: Secondary | ICD-10-CM | POA: Diagnosis not present

## 2015-05-07 DIAGNOSIS — M792 Neuralgia and neuritis, unspecified: Secondary | ICD-10-CM | POA: Diagnosis not present

## 2015-05-07 DIAGNOSIS — G4701 Insomnia due to medical condition: Secondary | ICD-10-CM | POA: Diagnosis not present

## 2015-08-19 DIAGNOSIS — Z1389 Encounter for screening for other disorder: Secondary | ICD-10-CM | POA: Diagnosis not present

## 2015-08-19 DIAGNOSIS — I10 Essential (primary) hypertension: Secondary | ICD-10-CM | POA: Diagnosis not present

## 2015-08-19 DIAGNOSIS — K210 Gastro-esophageal reflux disease with esophagitis: Secondary | ICD-10-CM | POA: Diagnosis not present

## 2015-08-19 DIAGNOSIS — N183 Chronic kidney disease, stage 3 (moderate): Secondary | ICD-10-CM | POA: Diagnosis not present

## 2015-08-19 DIAGNOSIS — Z0000 Encounter for general adult medical examination without abnormal findings: Secondary | ICD-10-CM | POA: Diagnosis not present

## 2015-08-19 DIAGNOSIS — E1149 Type 2 diabetes mellitus with other diabetic neurological complication: Secondary | ICD-10-CM | POA: Diagnosis not present

## 2015-08-19 DIAGNOSIS — I6789 Other cerebrovascular disease: Secondary | ICD-10-CM | POA: Diagnosis not present

## 2015-09-16 DIAGNOSIS — Z1231 Encounter for screening mammogram for malignant neoplasm of breast: Secondary | ICD-10-CM | POA: Diagnosis not present

## 2015-11-04 DIAGNOSIS — I10 Essential (primary) hypertension: Secondary | ICD-10-CM | POA: Diagnosis not present

## 2015-11-04 DIAGNOSIS — I6990 Unspecified sequelae of unspecified cerebrovascular disease: Secondary | ICD-10-CM | POA: Diagnosis not present

## 2015-11-04 DIAGNOSIS — M792 Neuralgia and neuritis, unspecified: Secondary | ICD-10-CM | POA: Diagnosis not present

## 2015-11-04 DIAGNOSIS — E119 Type 2 diabetes mellitus without complications: Secondary | ICD-10-CM | POA: Diagnosis not present

## 2015-11-04 DIAGNOSIS — R2689 Other abnormalities of gait and mobility: Secondary | ICD-10-CM | POA: Diagnosis not present

## 2015-11-04 DIAGNOSIS — I69354 Hemiplegia and hemiparesis following cerebral infarction affecting left non-dominant side: Secondary | ICD-10-CM | POA: Diagnosis not present

## 2015-11-04 DIAGNOSIS — E785 Hyperlipidemia, unspecified: Secondary | ICD-10-CM | POA: Diagnosis not present

## 2015-11-19 DIAGNOSIS — K5909 Other constipation: Secondary | ICD-10-CM | POA: Diagnosis not present

## 2015-11-19 DIAGNOSIS — K210 Gastro-esophageal reflux disease with esophagitis: Secondary | ICD-10-CM | POA: Diagnosis not present

## 2015-11-19 DIAGNOSIS — I6789 Other cerebrovascular disease: Secondary | ICD-10-CM | POA: Diagnosis not present

## 2015-11-19 DIAGNOSIS — N183 Chronic kidney disease, stage 3 (moderate): Secondary | ICD-10-CM | POA: Diagnosis not present

## 2015-11-19 DIAGNOSIS — I200 Unstable angina: Secondary | ICD-10-CM | POA: Diagnosis not present

## 2015-11-19 DIAGNOSIS — I10 Essential (primary) hypertension: Secondary | ICD-10-CM | POA: Diagnosis not present

## 2015-11-19 DIAGNOSIS — E6609 Other obesity due to excess calories: Secondary | ICD-10-CM | POA: Diagnosis not present

## 2015-11-19 DIAGNOSIS — E1149 Type 2 diabetes mellitus with other diabetic neurological complication: Secondary | ICD-10-CM | POA: Diagnosis not present

## 2015-11-22 DIAGNOSIS — R079 Chest pain, unspecified: Secondary | ICD-10-CM | POA: Diagnosis not present

## 2015-11-22 DIAGNOSIS — R931 Abnormal findings on diagnostic imaging of heart and coronary circulation: Secondary | ICD-10-CM | POA: Diagnosis not present

## 2016-02-20 DIAGNOSIS — E1149 Type 2 diabetes mellitus with other diabetic neurological complication: Secondary | ICD-10-CM | POA: Diagnosis not present

## 2016-02-20 DIAGNOSIS — I6789 Other cerebrovascular disease: Secondary | ICD-10-CM | POA: Diagnosis not present

## 2016-02-20 DIAGNOSIS — N182 Chronic kidney disease, stage 2 (mild): Secondary | ICD-10-CM | POA: Diagnosis not present

## 2016-02-20 DIAGNOSIS — K210 Gastro-esophageal reflux disease with esophagitis: Secondary | ICD-10-CM | POA: Diagnosis not present

## 2016-02-20 DIAGNOSIS — I200 Unstable angina: Secondary | ICD-10-CM | POA: Diagnosis not present

## 2016-02-20 DIAGNOSIS — E6609 Other obesity due to excess calories: Secondary | ICD-10-CM | POA: Diagnosis not present

## 2016-02-20 DIAGNOSIS — I10 Essential (primary) hypertension: Secondary | ICD-10-CM | POA: Diagnosis not present

## 2016-02-25 DIAGNOSIS — E119 Type 2 diabetes mellitus without complications: Secondary | ICD-10-CM | POA: Diagnosis not present

## 2016-05-15 DIAGNOSIS — Z79899 Other long term (current) drug therapy: Secondary | ICD-10-CM | POA: Diagnosis not present

## 2016-05-15 DIAGNOSIS — Z794 Long term (current) use of insulin: Secondary | ICD-10-CM | POA: Diagnosis not present

## 2016-05-15 DIAGNOSIS — K219 Gastro-esophageal reflux disease without esophagitis: Secondary | ICD-10-CM | POA: Diagnosis not present

## 2016-05-15 DIAGNOSIS — E785 Hyperlipidemia, unspecified: Secondary | ICD-10-CM | POA: Diagnosis not present

## 2016-05-15 DIAGNOSIS — E119 Type 2 diabetes mellitus without complications: Secondary | ICD-10-CM | POA: Diagnosis not present

## 2016-05-15 DIAGNOSIS — Z7982 Long term (current) use of aspirin: Secondary | ICD-10-CM | POA: Diagnosis not present

## 2016-05-15 DIAGNOSIS — N898 Other specified noninflammatory disorders of vagina: Secondary | ICD-10-CM | POA: Diagnosis not present

## 2016-05-15 DIAGNOSIS — Z8673 Personal history of transient ischemic attack (TIA), and cerebral infarction without residual deficits: Secondary | ICD-10-CM | POA: Diagnosis not present

## 2016-05-18 DIAGNOSIS — I200 Unstable angina: Secondary | ICD-10-CM | POA: Diagnosis not present

## 2016-05-18 DIAGNOSIS — E1149 Type 2 diabetes mellitus with other diabetic neurological complication: Secondary | ICD-10-CM | POA: Diagnosis not present

## 2016-05-18 DIAGNOSIS — N182 Chronic kidney disease, stage 2 (mild): Secondary | ICD-10-CM | POA: Diagnosis not present

## 2016-05-18 DIAGNOSIS — I10 Essential (primary) hypertension: Secondary | ICD-10-CM | POA: Diagnosis not present

## 2016-05-18 DIAGNOSIS — N3941 Urge incontinence: Secondary | ICD-10-CM | POA: Diagnosis not present

## 2016-05-18 DIAGNOSIS — E6609 Other obesity due to excess calories: Secondary | ICD-10-CM | POA: Diagnosis not present

## 2016-05-18 DIAGNOSIS — K210 Gastro-esophageal reflux disease with esophagitis: Secondary | ICD-10-CM | POA: Diagnosis not present

## 2016-05-18 DIAGNOSIS — I6789 Other cerebrovascular disease: Secondary | ICD-10-CM | POA: Diagnosis not present

## 2016-05-19 DIAGNOSIS — M792 Neuralgia and neuritis, unspecified: Secondary | ICD-10-CM | POA: Diagnosis not present

## 2016-05-19 DIAGNOSIS — I69354 Hemiplegia and hemiparesis following cerebral infarction affecting left non-dominant side: Secondary | ICD-10-CM | POA: Diagnosis not present

## 2016-05-19 DIAGNOSIS — I10 Essential (primary) hypertension: Secondary | ICD-10-CM | POA: Diagnosis not present

## 2016-05-19 DIAGNOSIS — I6990 Unspecified sequelae of unspecified cerebrovascular disease: Secondary | ICD-10-CM | POA: Diagnosis not present

## 2016-05-19 DIAGNOSIS — E119 Type 2 diabetes mellitus without complications: Secondary | ICD-10-CM | POA: Diagnosis not present

## 2016-05-19 DIAGNOSIS — R2689 Other abnormalities of gait and mobility: Secondary | ICD-10-CM | POA: Diagnosis not present

## 2016-05-19 DIAGNOSIS — E785 Hyperlipidemia, unspecified: Secondary | ICD-10-CM | POA: Diagnosis not present

## 2016-07-27 DIAGNOSIS — I6789 Other cerebrovascular disease: Secondary | ICD-10-CM | POA: Diagnosis not present

## 2016-07-27 DIAGNOSIS — K210 Gastro-esophageal reflux disease with esophagitis: Secondary | ICD-10-CM | POA: Diagnosis not present

## 2016-07-27 DIAGNOSIS — I200 Unstable angina: Secondary | ICD-10-CM | POA: Diagnosis not present

## 2016-07-27 DIAGNOSIS — E6609 Other obesity due to excess calories: Secondary | ICD-10-CM | POA: Diagnosis not present

## 2016-07-27 DIAGNOSIS — I10 Essential (primary) hypertension: Secondary | ICD-10-CM | POA: Diagnosis not present

## 2016-07-27 DIAGNOSIS — N182 Chronic kidney disease, stage 2 (mild): Secondary | ICD-10-CM | POA: Diagnosis not present

## 2016-07-27 DIAGNOSIS — E1149 Type 2 diabetes mellitus with other diabetic neurological complication: Secondary | ICD-10-CM | POA: Diagnosis not present

## 2016-09-15 DIAGNOSIS — I10 Essential (primary) hypertension: Secondary | ICD-10-CM | POA: Diagnosis not present

## 2016-09-15 DIAGNOSIS — E1149 Type 2 diabetes mellitus with other diabetic neurological complication: Secondary | ICD-10-CM | POA: Diagnosis not present

## 2016-09-15 DIAGNOSIS — K210 Gastro-esophageal reflux disease with esophagitis: Secondary | ICD-10-CM | POA: Diagnosis not present

## 2016-09-15 DIAGNOSIS — N182 Chronic kidney disease, stage 2 (mild): Secondary | ICD-10-CM | POA: Diagnosis not present

## 2016-09-15 DIAGNOSIS — E6609 Other obesity due to excess calories: Secondary | ICD-10-CM | POA: Diagnosis not present

## 2016-09-23 DIAGNOSIS — I6990 Unspecified sequelae of unspecified cerebrovascular disease: Secondary | ICD-10-CM | POA: Diagnosis not present

## 2016-09-23 DIAGNOSIS — I10 Essential (primary) hypertension: Secondary | ICD-10-CM | POA: Diagnosis not present

## 2016-09-23 DIAGNOSIS — M792 Neuralgia and neuritis, unspecified: Secondary | ICD-10-CM | POA: Diagnosis not present

## 2016-09-23 DIAGNOSIS — R208 Other disturbances of skin sensation: Secondary | ICD-10-CM | POA: Diagnosis not present

## 2016-09-23 DIAGNOSIS — E785 Hyperlipidemia, unspecified: Secondary | ICD-10-CM | POA: Diagnosis not present

## 2016-09-23 DIAGNOSIS — E119 Type 2 diabetes mellitus without complications: Secondary | ICD-10-CM | POA: Diagnosis not present

## 2016-09-23 DIAGNOSIS — R2689 Other abnormalities of gait and mobility: Secondary | ICD-10-CM | POA: Diagnosis not present

## 2016-09-23 DIAGNOSIS — I69354 Hemiplegia and hemiparesis following cerebral infarction affecting left non-dominant side: Secondary | ICD-10-CM | POA: Diagnosis not present

## 2016-10-08 DIAGNOSIS — I69354 Hemiplegia and hemiparesis following cerebral infarction affecting left non-dominant side: Secondary | ICD-10-CM | POA: Diagnosis not present

## 2016-10-08 DIAGNOSIS — M792 Neuralgia and neuritis, unspecified: Secondary | ICD-10-CM | POA: Diagnosis not present

## 2016-10-08 DIAGNOSIS — R2689 Other abnormalities of gait and mobility: Secondary | ICD-10-CM | POA: Diagnosis not present

## 2016-10-08 DIAGNOSIS — M5416 Radiculopathy, lumbar region: Secondary | ICD-10-CM | POA: Diagnosis not present

## 2016-10-08 DIAGNOSIS — E119 Type 2 diabetes mellitus without complications: Secondary | ICD-10-CM | POA: Diagnosis not present

## 2016-10-08 DIAGNOSIS — I6990 Unspecified sequelae of unspecified cerebrovascular disease: Secondary | ICD-10-CM | POA: Diagnosis not present

## 2016-10-08 DIAGNOSIS — I10 Essential (primary) hypertension: Secondary | ICD-10-CM | POA: Diagnosis not present

## 2016-10-08 DIAGNOSIS — E1140 Type 2 diabetes mellitus with diabetic neuropathy, unspecified: Secondary | ICD-10-CM | POA: Diagnosis not present

## 2016-10-08 DIAGNOSIS — E785 Hyperlipidemia, unspecified: Secondary | ICD-10-CM | POA: Diagnosis not present

## 2016-10-25 DIAGNOSIS — E119 Type 2 diabetes mellitus without complications: Secondary | ICD-10-CM | POA: Diagnosis not present

## 2016-10-25 DIAGNOSIS — R062 Wheezing: Secondary | ICD-10-CM | POA: Diagnosis not present

## 2016-10-25 DIAGNOSIS — Z79899 Other long term (current) drug therapy: Secondary | ICD-10-CM | POA: Diagnosis not present

## 2016-10-25 DIAGNOSIS — Z794 Long term (current) use of insulin: Secondary | ICD-10-CM | POA: Diagnosis not present

## 2016-10-25 DIAGNOSIS — K219 Gastro-esophageal reflux disease without esophagitis: Secondary | ICD-10-CM | POA: Diagnosis not present

## 2016-10-25 DIAGNOSIS — J4531 Mild persistent asthma with (acute) exacerbation: Secondary | ICD-10-CM | POA: Diagnosis not present

## 2016-10-25 DIAGNOSIS — Z8673 Personal history of transient ischemic attack (TIA), and cerebral infarction without residual deficits: Secondary | ICD-10-CM | POA: Diagnosis not present

## 2016-10-25 DIAGNOSIS — E785 Hyperlipidemia, unspecified: Secondary | ICD-10-CM | POA: Diagnosis not present

## 2016-10-25 DIAGNOSIS — Z7982 Long term (current) use of aspirin: Secondary | ICD-10-CM | POA: Diagnosis not present

## 2016-10-26 DIAGNOSIS — J4540 Moderate persistent asthma, uncomplicated: Secondary | ICD-10-CM | POA: Diagnosis not present

## 2016-12-15 ENCOUNTER — Ambulatory Visit (INDEPENDENT_AMBULATORY_CARE_PROVIDER_SITE_OTHER): Payer: Medicare Other | Admitting: Urology

## 2016-12-15 DIAGNOSIS — R3915 Urgency of urination: Secondary | ICD-10-CM

## 2016-12-15 DIAGNOSIS — R351 Nocturia: Secondary | ICD-10-CM | POA: Diagnosis not present

## 2016-12-15 DIAGNOSIS — N3941 Urge incontinence: Secondary | ICD-10-CM

## 2016-12-15 DIAGNOSIS — R350 Frequency of micturition: Secondary | ICD-10-CM | POA: Diagnosis not present

## 2016-12-17 DIAGNOSIS — K210 Gastro-esophageal reflux disease with esophagitis: Secondary | ICD-10-CM | POA: Diagnosis not present

## 2016-12-17 DIAGNOSIS — I6789 Other cerebrovascular disease: Secondary | ICD-10-CM | POA: Diagnosis not present

## 2016-12-17 DIAGNOSIS — N3941 Urge incontinence: Secondary | ICD-10-CM | POA: Diagnosis not present

## 2016-12-17 DIAGNOSIS — E1143 Type 2 diabetes mellitus with diabetic autonomic (poly)neuropathy: Secondary | ICD-10-CM | POA: Diagnosis not present

## 2016-12-17 DIAGNOSIS — I10 Essential (primary) hypertension: Secondary | ICD-10-CM | POA: Diagnosis not present

## 2016-12-17 DIAGNOSIS — J4521 Mild intermittent asthma with (acute) exacerbation: Secondary | ICD-10-CM | POA: Diagnosis not present

## 2017-03-25 DIAGNOSIS — N3941 Urge incontinence: Secondary | ICD-10-CM | POA: Diagnosis not present

## 2017-03-25 DIAGNOSIS — Z1389 Encounter for screening for other disorder: Secondary | ICD-10-CM | POA: Diagnosis not present

## 2017-03-25 DIAGNOSIS — I10 Essential (primary) hypertension: Secondary | ICD-10-CM | POA: Diagnosis not present

## 2017-03-25 DIAGNOSIS — J4521 Mild intermittent asthma with (acute) exacerbation: Secondary | ICD-10-CM | POA: Diagnosis not present

## 2017-03-25 DIAGNOSIS — K210 Gastro-esophageal reflux disease with esophagitis: Secondary | ICD-10-CM | POA: Diagnosis not present

## 2017-03-25 DIAGNOSIS — Z0000 Encounter for general adult medical examination without abnormal findings: Secondary | ICD-10-CM | POA: Diagnosis not present

## 2017-03-25 DIAGNOSIS — E1143 Type 2 diabetes mellitus with diabetic autonomic (poly)neuropathy: Secondary | ICD-10-CM | POA: Diagnosis not present

## 2017-04-20 ENCOUNTER — Ambulatory Visit: Payer: Medicare Other | Admitting: Urology

## 2017-06-28 DIAGNOSIS — K210 Gastro-esophageal reflux disease with esophagitis: Secondary | ICD-10-CM | POA: Diagnosis not present

## 2017-06-28 DIAGNOSIS — E1143 Type 2 diabetes mellitus with diabetic autonomic (poly)neuropathy: Secondary | ICD-10-CM | POA: Diagnosis not present

## 2017-06-28 DIAGNOSIS — R5383 Other fatigue: Secondary | ICD-10-CM | POA: Diagnosis not present

## 2017-06-28 DIAGNOSIS — J4521 Mild intermittent asthma with (acute) exacerbation: Secondary | ICD-10-CM | POA: Diagnosis not present

## 2017-06-28 DIAGNOSIS — R0602 Shortness of breath: Secondary | ICD-10-CM | POA: Diagnosis not present

## 2017-06-28 DIAGNOSIS — I10 Essential (primary) hypertension: Secondary | ICD-10-CM | POA: Diagnosis not present

## 2017-09-06 DIAGNOSIS — M810 Age-related osteoporosis without current pathological fracture: Secondary | ICD-10-CM | POA: Diagnosis not present

## 2017-09-06 DIAGNOSIS — E2839 Other primary ovarian failure: Secondary | ICD-10-CM | POA: Diagnosis not present

## 2017-09-17 DIAGNOSIS — Z1231 Encounter for screening mammogram for malignant neoplasm of breast: Secondary | ICD-10-CM | POA: Diagnosis not present

## 2017-09-28 DIAGNOSIS — K210 Gastro-esophageal reflux disease with esophagitis: Secondary | ICD-10-CM | POA: Diagnosis not present

## 2017-09-28 DIAGNOSIS — E1143 Type 2 diabetes mellitus with diabetic autonomic (poly)neuropathy: Secondary | ICD-10-CM | POA: Diagnosis not present

## 2017-09-28 DIAGNOSIS — J4521 Mild intermittent asthma with (acute) exacerbation: Secondary | ICD-10-CM | POA: Diagnosis not present

## 2017-09-28 DIAGNOSIS — I10 Essential (primary) hypertension: Secondary | ICD-10-CM | POA: Diagnosis not present

## 2017-09-28 DIAGNOSIS — N189 Chronic kidney disease, unspecified: Secondary | ICD-10-CM | POA: Diagnosis not present

## 2018-01-10 DIAGNOSIS — J4540 Moderate persistent asthma, uncomplicated: Secondary | ICD-10-CM | POA: Diagnosis not present

## 2018-04-25 DIAGNOSIS — Z0000 Encounter for general adult medical examination without abnormal findings: Secondary | ICD-10-CM | POA: Diagnosis not present

## 2018-04-25 DIAGNOSIS — E782 Mixed hyperlipidemia: Secondary | ICD-10-CM | POA: Diagnosis not present

## 2018-04-25 DIAGNOSIS — E1165 Type 2 diabetes mellitus with hyperglycemia: Secondary | ICD-10-CM | POA: Diagnosis not present

## 2018-04-25 DIAGNOSIS — K210 Gastro-esophageal reflux disease with esophagitis: Secondary | ICD-10-CM | POA: Diagnosis not present

## 2018-04-25 DIAGNOSIS — J4540 Moderate persistent asthma, uncomplicated: Secondary | ICD-10-CM | POA: Diagnosis not present

## 2018-04-25 DIAGNOSIS — Z1389 Encounter for screening for other disorder: Secondary | ICD-10-CM | POA: Diagnosis not present

## 2018-04-25 DIAGNOSIS — N182 Chronic kidney disease, stage 2 (mild): Secondary | ICD-10-CM | POA: Diagnosis not present

## 2018-05-30 DIAGNOSIS — H40013 Open angle with borderline findings, low risk, bilateral: Secondary | ICD-10-CM | POA: Diagnosis not present

## 2018-09-13 DIAGNOSIS — E782 Mixed hyperlipidemia: Secondary | ICD-10-CM | POA: Diagnosis not present

## 2018-09-13 DIAGNOSIS — K210 Gastro-esophageal reflux disease with esophagitis: Secondary | ICD-10-CM | POA: Diagnosis not present

## 2018-09-13 DIAGNOSIS — E1121 Type 2 diabetes mellitus with diabetic nephropathy: Secondary | ICD-10-CM | POA: Diagnosis not present

## 2018-09-13 DIAGNOSIS — N182 Chronic kidney disease, stage 2 (mild): Secondary | ICD-10-CM | POA: Diagnosis not present

## 2019-01-02 DIAGNOSIS — K210 Gastro-esophageal reflux disease with esophagitis: Secondary | ICD-10-CM | POA: Diagnosis not present

## 2019-01-02 DIAGNOSIS — E782 Mixed hyperlipidemia: Secondary | ICD-10-CM | POA: Diagnosis not present

## 2019-01-02 DIAGNOSIS — N182 Chronic kidney disease, stage 2 (mild): Secondary | ICD-10-CM | POA: Diagnosis not present

## 2019-01-02 DIAGNOSIS — E1121 Type 2 diabetes mellitus with diabetic nephropathy: Secondary | ICD-10-CM | POA: Diagnosis not present

## 2019-01-05 DIAGNOSIS — H40053 Ocular hypertension, bilateral: Secondary | ICD-10-CM | POA: Diagnosis not present

## 2019-01-18 ENCOUNTER — Other Ambulatory Visit: Payer: Self-pay

## 2019-03-30 DIAGNOSIS — R0789 Other chest pain: Secondary | ICD-10-CM | POA: Diagnosis not present

## 2019-03-30 DIAGNOSIS — N182 Chronic kidney disease, stage 2 (mild): Secondary | ICD-10-CM | POA: Diagnosis not present

## 2019-03-30 DIAGNOSIS — I10 Essential (primary) hypertension: Secondary | ICD-10-CM | POA: Diagnosis not present

## 2019-03-30 DIAGNOSIS — K219 Gastro-esophageal reflux disease without esophagitis: Secondary | ICD-10-CM | POA: Diagnosis not present

## 2019-03-30 DIAGNOSIS — E782 Mixed hyperlipidemia: Secondary | ICD-10-CM | POA: Diagnosis not present

## 2019-03-30 DIAGNOSIS — E1121 Type 2 diabetes mellitus with diabetic nephropathy: Secondary | ICD-10-CM | POA: Diagnosis not present

## 2019-03-31 DIAGNOSIS — I10 Essential (primary) hypertension: Secondary | ICD-10-CM | POA: Diagnosis not present

## 2019-03-31 DIAGNOSIS — E1121 Type 2 diabetes mellitus with diabetic nephropathy: Secondary | ICD-10-CM | POA: Diagnosis not present

## 2019-03-31 DIAGNOSIS — E782 Mixed hyperlipidemia: Secondary | ICD-10-CM | POA: Diagnosis not present

## 2019-05-29 DIAGNOSIS — Z1389 Encounter for screening for other disorder: Secondary | ICD-10-CM | POA: Diagnosis not present

## 2019-05-29 DIAGNOSIS — Z0000 Encounter for general adult medical examination without abnormal findings: Secondary | ICD-10-CM | POA: Diagnosis not present

## 2019-05-29 DIAGNOSIS — E1121 Type 2 diabetes mellitus with diabetic nephropathy: Secondary | ICD-10-CM | POA: Diagnosis not present

## 2019-05-29 DIAGNOSIS — E782 Mixed hyperlipidemia: Secondary | ICD-10-CM | POA: Diagnosis not present

## 2019-05-29 DIAGNOSIS — I10 Essential (primary) hypertension: Secondary | ICD-10-CM | POA: Diagnosis not present

## 2019-05-29 DIAGNOSIS — N182 Chronic kidney disease, stage 2 (mild): Secondary | ICD-10-CM | POA: Diagnosis not present

## 2019-05-29 DIAGNOSIS — K2100 Gastro-esophageal reflux disease with esophagitis, without bleeding: Secondary | ICD-10-CM | POA: Diagnosis not present

## 2019-09-13 DIAGNOSIS — Z0000 Encounter for general adult medical examination without abnormal findings: Secondary | ICD-10-CM | POA: Diagnosis not present

## 2019-09-13 DIAGNOSIS — I10 Essential (primary) hypertension: Secondary | ICD-10-CM | POA: Diagnosis not present

## 2019-09-13 DIAGNOSIS — E782 Mixed hyperlipidemia: Secondary | ICD-10-CM | POA: Diagnosis not present

## 2019-09-13 DIAGNOSIS — E1121 Type 2 diabetes mellitus with diabetic nephropathy: Secondary | ICD-10-CM | POA: Diagnosis not present

## 2019-09-13 DIAGNOSIS — N182 Chronic kidney disease, stage 2 (mild): Secondary | ICD-10-CM | POA: Diagnosis not present

## 2019-09-13 DIAGNOSIS — K2100 Gastro-esophageal reflux disease with esophagitis, without bleeding: Secondary | ICD-10-CM | POA: Diagnosis not present

## 2019-10-04 DIAGNOSIS — E669 Obesity, unspecified: Secondary | ICD-10-CM | POA: Diagnosis not present

## 2019-10-04 DIAGNOSIS — K219 Gastro-esophageal reflux disease without esophagitis: Secondary | ICD-10-CM | POA: Diagnosis not present

## 2019-10-04 DIAGNOSIS — E785 Hyperlipidemia, unspecified: Secondary | ICD-10-CM | POA: Diagnosis not present

## 2019-10-04 DIAGNOSIS — Z79899 Other long term (current) drug therapy: Secondary | ICD-10-CM | POA: Diagnosis not present

## 2019-10-04 DIAGNOSIS — Z794 Long term (current) use of insulin: Secondary | ICD-10-CM | POA: Diagnosis not present

## 2019-10-04 DIAGNOSIS — R1013 Epigastric pain: Secondary | ICD-10-CM | POA: Diagnosis not present

## 2019-10-04 DIAGNOSIS — R079 Chest pain, unspecified: Secondary | ICD-10-CM | POA: Diagnosis not present

## 2019-10-04 DIAGNOSIS — K8020 Calculus of gallbladder without cholecystitis without obstruction: Secondary | ICD-10-CM | POA: Diagnosis not present

## 2019-10-04 DIAGNOSIS — E119 Type 2 diabetes mellitus without complications: Secondary | ICD-10-CM | POA: Diagnosis not present

## 2019-11-21 DIAGNOSIS — E1121 Type 2 diabetes mellitus with diabetic nephropathy: Secondary | ICD-10-CM | POA: Diagnosis not present

## 2019-11-21 DIAGNOSIS — K2100 Gastro-esophageal reflux disease with esophagitis, without bleeding: Secondary | ICD-10-CM | POA: Diagnosis not present

## 2019-11-21 DIAGNOSIS — E782 Mixed hyperlipidemia: Secondary | ICD-10-CM | POA: Diagnosis not present

## 2019-11-21 DIAGNOSIS — I10 Essential (primary) hypertension: Secondary | ICD-10-CM | POA: Diagnosis not present

## 2019-11-21 DIAGNOSIS — R1310 Dysphagia, unspecified: Secondary | ICD-10-CM | POA: Diagnosis not present

## 2019-11-21 DIAGNOSIS — N182 Chronic kidney disease, stage 2 (mild): Secondary | ICD-10-CM | POA: Diagnosis not present

## 2019-11-23 ENCOUNTER — Encounter: Payer: Self-pay | Admitting: Internal Medicine

## 2020-01-09 ENCOUNTER — Ambulatory Visit: Payer: Medicare Other | Admitting: Gastroenterology

## 2020-02-21 DIAGNOSIS — E782 Mixed hyperlipidemia: Secondary | ICD-10-CM | POA: Diagnosis not present

## 2020-02-21 DIAGNOSIS — N182 Chronic kidney disease, stage 2 (mild): Secondary | ICD-10-CM | POA: Diagnosis not present

## 2020-02-21 DIAGNOSIS — K2100 Gastro-esophageal reflux disease with esophagitis, without bleeding: Secondary | ICD-10-CM | POA: Diagnosis not present

## 2020-02-21 DIAGNOSIS — E1121 Type 2 diabetes mellitus with diabetic nephropathy: Secondary | ICD-10-CM | POA: Diagnosis not present

## 2020-02-21 DIAGNOSIS — R1310 Dysphagia, unspecified: Secondary | ICD-10-CM | POA: Diagnosis not present

## 2020-02-21 DIAGNOSIS — I10 Essential (primary) hypertension: Secondary | ICD-10-CM | POA: Diagnosis not present

## 2020-02-28 DIAGNOSIS — Z23 Encounter for immunization: Secondary | ICD-10-CM | POA: Diagnosis not present

## 2020-03-27 DIAGNOSIS — Z23 Encounter for immunization: Secondary | ICD-10-CM | POA: Diagnosis not present

## 2020-05-09 DIAGNOSIS — H35033 Hypertensive retinopathy, bilateral: Secondary | ICD-10-CM | POA: Diagnosis not present

## 2020-06-03 DIAGNOSIS — I10 Essential (primary) hypertension: Secondary | ICD-10-CM | POA: Diagnosis not present

## 2020-06-03 DIAGNOSIS — E782 Mixed hyperlipidemia: Secondary | ICD-10-CM | POA: Diagnosis not present

## 2020-06-03 DIAGNOSIS — R1310 Dysphagia, unspecified: Secondary | ICD-10-CM | POA: Diagnosis not present

## 2020-06-03 DIAGNOSIS — Z1331 Encounter for screening for depression: Secondary | ICD-10-CM | POA: Diagnosis not present

## 2020-06-03 DIAGNOSIS — K2100 Gastro-esophageal reflux disease with esophagitis, without bleeding: Secondary | ICD-10-CM | POA: Diagnosis not present

## 2020-06-03 DIAGNOSIS — Z0000 Encounter for general adult medical examination without abnormal findings: Secondary | ICD-10-CM | POA: Diagnosis not present

## 2020-06-03 DIAGNOSIS — N182 Chronic kidney disease, stage 2 (mild): Secondary | ICD-10-CM | POA: Diagnosis not present

## 2020-06-03 DIAGNOSIS — E1121 Type 2 diabetes mellitus with diabetic nephropathy: Secondary | ICD-10-CM | POA: Diagnosis not present

## 2020-07-24 DIAGNOSIS — R1310 Dysphagia, unspecified: Secondary | ICD-10-CM | POA: Diagnosis not present

## 2020-07-24 DIAGNOSIS — E782 Mixed hyperlipidemia: Secondary | ICD-10-CM | POA: Diagnosis not present

## 2020-07-24 DIAGNOSIS — K2100 Gastro-esophageal reflux disease with esophagitis, without bleeding: Secondary | ICD-10-CM | POA: Diagnosis not present

## 2020-07-24 DIAGNOSIS — E1121 Type 2 diabetes mellitus with diabetic nephropathy: Secondary | ICD-10-CM | POA: Diagnosis not present

## 2020-07-24 DIAGNOSIS — N182 Chronic kidney disease, stage 2 (mild): Secondary | ICD-10-CM | POA: Diagnosis not present

## 2020-07-24 DIAGNOSIS — Z1331 Encounter for screening for depression: Secondary | ICD-10-CM | POA: Diagnosis not present

## 2020-07-24 DIAGNOSIS — I10 Essential (primary) hypertension: Secondary | ICD-10-CM | POA: Diagnosis not present

## 2020-07-24 DIAGNOSIS — Z0000 Encounter for general adult medical examination without abnormal findings: Secondary | ICD-10-CM | POA: Diagnosis not present

## 2020-08-20 DIAGNOSIS — H401121 Primary open-angle glaucoma, left eye, mild stage: Secondary | ICD-10-CM | POA: Diagnosis not present

## 2020-09-24 DIAGNOSIS — Z1231 Encounter for screening mammogram for malignant neoplasm of breast: Secondary | ICD-10-CM | POA: Diagnosis not present

## 2020-10-16 DIAGNOSIS — R921 Mammographic calcification found on diagnostic imaging of breast: Secondary | ICD-10-CM | POA: Diagnosis not present

## 2020-10-16 DIAGNOSIS — R928 Other abnormal and inconclusive findings on diagnostic imaging of breast: Secondary | ICD-10-CM | POA: Diagnosis not present

## 2020-10-31 DIAGNOSIS — E1121 Type 2 diabetes mellitus with diabetic nephropathy: Secondary | ICD-10-CM | POA: Diagnosis not present

## 2020-10-31 DIAGNOSIS — I10 Essential (primary) hypertension: Secondary | ICD-10-CM | POA: Diagnosis not present

## 2020-10-31 DIAGNOSIS — K2100 Gastro-esophageal reflux disease with esophagitis, without bleeding: Secondary | ICD-10-CM | POA: Diagnosis not present

## 2020-10-31 DIAGNOSIS — E782 Mixed hyperlipidemia: Secondary | ICD-10-CM | POA: Diagnosis not present

## 2020-10-31 DIAGNOSIS — N182 Chronic kidney disease, stage 2 (mild): Secondary | ICD-10-CM | POA: Diagnosis not present

## 2020-11-07 ENCOUNTER — Encounter: Payer: Self-pay | Admitting: Internal Medicine

## 2021-02-06 ENCOUNTER — Encounter: Payer: Self-pay | Admitting: Cardiology

## 2021-02-06 DIAGNOSIS — K2100 Gastro-esophageal reflux disease with esophagitis, without bleeding: Secondary | ICD-10-CM | POA: Diagnosis not present

## 2021-02-06 DIAGNOSIS — I10 Essential (primary) hypertension: Secondary | ICD-10-CM | POA: Diagnosis not present

## 2021-02-06 DIAGNOSIS — E782 Mixed hyperlipidemia: Secondary | ICD-10-CM | POA: Diagnosis not present

## 2021-02-06 DIAGNOSIS — N182 Chronic kidney disease, stage 2 (mild): Secondary | ICD-10-CM | POA: Diagnosis not present

## 2021-02-06 DIAGNOSIS — E1121 Type 2 diabetes mellitus with diabetic nephropathy: Secondary | ICD-10-CM | POA: Diagnosis not present

## 2021-02-11 ENCOUNTER — Encounter: Payer: Self-pay | Admitting: Cardiology

## 2021-02-11 ENCOUNTER — Encounter: Payer: Self-pay | Admitting: *Deleted

## 2021-02-11 NOTE — Progress Notes (Signed)
Cardiology Office Note  Date: 02/12/2021   ID: Avagrace, Botelho Aug 11, 1950, MRN [id number]  PCP:  Neale Burly, MD  Cardiologist:  Rozann Lesches, MD Electrophysiologist:  None   Chief Complaint  Patient presents with   Shortness of Breath     History of Present Illness: Katherine Stokes is a 70 y.o. female referred for cardiology consultation by Dr. Sherrie Sport for the evaluation of shortness of breath.  She is here today with her daughter.  I reviewed the available records.  She states that for several months she has been experiencing dyspnea on exertion and fatigue with activities around the house, even taking a shower or cooking.  She does not report any exertional chest tightness or pressure however.  She does mention having asthma and uses MDIs and nebulizers with some improvement.  Also reports trouble with GERD.  She has a history of longstanding type 2 diabetes mellitus with previous stroke as well as hypertension and hyperlipidemia.  I reviewed her medications which are noted below.  She has been on stable medical regimen and reports reasonable control of blood pressure although her blood sugar has not been in check.  I personally reviewed her ECG today which shows normal sinus rhythm.  Past Medical History:  Diagnosis Date   Chronic kidney disease (CKD), stage II (mild)    Essential hypertension    GERD (gastroesophageal reflux disease)    History of stroke    Residual right leg weakness   Mixed hyperlipidemia    Type 2 diabetes mellitus (HCC)     Past Surgical History:  Procedure Laterality Date   ABDOMINAL HYSTERECTOMY     TONSILLECTOMY      Current Outpatient Medications  Medication Sig Dispense Refill   albuterol (PROVENTIL HFA) 108 (90 Base) MCG/ACT inhaler Inhale 2 puffs into the lungs every 6 (six) hours as needed for wheezing or shortness of breath.     albuterol (PROVENTIL) (2.5 MG/3ML) 0.083% nebulizer solution Take 2.5 mg by nebulization  every 6 (six) hours as needed for wheezing or shortness of breath.     allopurinol (ZYLOPRIM) 300 MG tablet Take 300 mg by mouth daily.     amLODipine (NORVASC) 5 MG tablet Take 5 mg by mouth daily.     aspirin EC 81 MG tablet Take 162 mg by mouth daily. Swallow whole.     benazepril-hydrochlorthiazide (LOTENSIN HCT) 20-25 MG tablet Take 1 tablet by mouth daily.     carvedilol (COREG) 25 MG tablet Take 25 mg by mouth 2 (two) times daily with a meal.     cholecalciferol (VITAMIN D) 25 MCG (1000 UNIT) tablet Take 1,000 Units by mouth daily.     diclofenac Sodium (VOLTAREN) 1 % GEL Apply 2 g topically 4 (four) times daily.     famotidine (PEPCID) 40 MG tablet Take 40 mg by mouth daily.     gemfibrozil (LOPID) 600 MG tablet Take 600 mg by mouth 2 (two) times daily before a meal.     insulin aspart (NOVOLOG FLEXPEN) 100 UNIT/ML FlexPen Inject 25 Units into the skin 3 (three) times daily with meals.     insulin glargine (LANTUS SOLOSTAR) 100 UNIT/ML Solostar Pen Inject 40 Units into the skin 2 (two) times daily.     latanoprost (XALATAN) 0.005 % ophthalmic solution Place 1 drop into both eyes at bedtime.     Omega-3 Fatty Acids (FISH OIL) 1000 MG CAPS Take 1 capsule by mouth 3 (three) times daily.  pantoprazole (PROTONIX) 40 MG tablet Take 40 mg by mouth daily.     sodium bicarbonate 650 MG tablet Take 650 mg by mouth 3 (three) times daily.     sucralfate (CARAFATE) 1 GM/10ML suspension Take 1 g by mouth 4 (four) times daily -  with meals and at bedtime.     vitamin B-12 (CYANOCOBALAMIN) 500 MCG tablet Take 500 mcg by mouth daily.     No current facility-administered medications for this visit.   Allergies:  Penicillins and Sulfa antibiotics   Social History: The patient  reports that she has never smoked. She has never used smokeless tobacco. She reports that she does not drink alcohol and does not use drugs.   Family History: The patient's family history includes Colon cancer in her father;  Dementia in her mother.   ROS: No palpitations or syncope.  Physical Exam: VS:  BP 126/64   Pulse 80   Ht 5\' 2"  (1.575 m)   Wt 227 lb 9.6 oz (103.2 kg)   SpO2 98%   BMI 41.63 kg/m , BMI Body mass index is 41.63 kg/m.  Wt Readings from Last 3 Encounters:  02/12/21 227 lb 9.6 oz (103.2 kg)    General: Patient appears comfortable at rest. HEENT: Conjunctiva and lids normal, wearing a mask. Neck: Supple, increased girth without obvious elevated JVP or carotid bruits, no thyromegaly. Lungs: Clear to auscultation, nonlabored breathing at rest. Cardiac: Regular rate and rhythm, no S3 or significant systolic murmur, no pericardial rub. Abdomen: Soft, nontender, bowel sounds present. Extremities: No pitting edema, distal pulses 2+. Skin: Warm and dry. Musculoskeletal: No kyphosis. Neuropsychiatric: Alert and oriented x3, affect grossly appropriate.  ECG:   No old tracings for review today.  Recent Labwork:  April 2021: Hemoglobin 11.9, platelets 415, AST 15, ALT 13, potassium 4.3, BUN 26, creatinine 1.50, troponin T less than 0.01  Other Studies Reviewed Today:  No prior cardiac testing for review today.  Assessment and Plan:  1.  Longstanding dyspnea on exertion and fatigue in a 70 year old woman with longstanding type 2 diabetes mellitus, hypertension, hyperlipidemia, and previous stroke with residual right leg weakness.  Baseline ECG is normal.  She has not undergone any cardiac structural or ischemic evaluation with time.  Plan is to obtain an echocardiogram and a Lexiscan Myoview, she cannot navigate a treadmill safely.  Also plan to get CBC, BMET, and TSH to exclude other possibilities.  2.  Essential hypertension, blood pressure is well controlled today.  She is on Norvasc, Lotensin HCTZ, and Coreg.  3.  Type 2 diabetes mellitus, currently on insulin with follow-up with Dr. Sherrie Sport.  4.  Mixed hyperlipidemia, on Lopid and omega-3 supplements.  Medication Adjustments/Labs  and Tests Ordered: Current medicines are reviewed at length with the patient today.  Concerns regarding medicines are outlined above.   Tests Ordered: Orders Placed This Encounter  Procedures   NM Myocar Multi W/Spect W/Wall Motion / EF   CBC   TSH   Basic metabolic panel   EKG 95-AOZH   ECHOCARDIOGRAM COMPLETE     Medication Changes: No orders of the defined types were placed in this encounter.   Disposition:  Follow up  test results.  Signed, Satira Sark, MD, [facility] 02/12/2021 3:16 PM    Secretary at [place], [place], [place] [address] Phone: [phone]; Fax: [phone]

## 2021-02-12 ENCOUNTER — Encounter: Payer: Self-pay | Admitting: *Deleted

## 2021-02-12 ENCOUNTER — Ambulatory Visit (INDEPENDENT_AMBULATORY_CARE_PROVIDER_SITE_OTHER): Payer: Medicare Other | Admitting: Cardiology

## 2021-02-12 ENCOUNTER — Encounter: Payer: Self-pay | Admitting: Cardiology

## 2021-02-12 ENCOUNTER — Telehealth: Payer: Self-pay | Admitting: Cardiology

## 2021-02-12 VITALS — BP 126/64 | HR 80 | Ht 62.0 in | Wt 227.6 lb

## 2021-02-12 DIAGNOSIS — R6889 Other general symptoms and signs: Secondary | ICD-10-CM | POA: Diagnosis not present

## 2021-02-12 DIAGNOSIS — R0600 Dyspnea, unspecified: Secondary | ICD-10-CM

## 2021-02-12 DIAGNOSIS — R5383 Other fatigue: Secondary | ICD-10-CM | POA: Diagnosis not present

## 2021-02-12 DIAGNOSIS — E782 Mixed hyperlipidemia: Secondary | ICD-10-CM | POA: Diagnosis not present

## 2021-02-12 DIAGNOSIS — E1165 Type 2 diabetes mellitus with hyperglycemia: Secondary | ICD-10-CM

## 2021-02-12 DIAGNOSIS — Z8673 Personal history of transient ischemic attack (TIA), and cerebral infarction without residual deficits: Secondary | ICD-10-CM | POA: Diagnosis not present

## 2021-02-12 DIAGNOSIS — I10 Essential (primary) hypertension: Secondary | ICD-10-CM | POA: Diagnosis not present

## 2021-02-12 DIAGNOSIS — R0609 Other forms of dyspnea: Secondary | ICD-10-CM

## 2021-02-12 NOTE — Patient Instructions (Addendum)
Medication Instructions:  Your physician recommends that you continue on your current medications as directed. Please refer to the Current Medication list given to you today.  Labwork: BMET, TSH, CBC-have done at [facility] on same day as your other test  Testing/Procedures: Your physician has requested that you have an echocardiogram. Echocardiography is a painless test that uses sound waves to create images of your heart. It provides your doctor with information about the size and shape of your heart and how well your heart's chambers and valves are working. This procedure takes approximately one hour. There are no restrictions for this procedure. Your physician has requested that you have a lexiscan myoview. For further information please visit [email or web address]. Please follow instruction sheet, as given.  Follow-Up: Your physician recommends that you schedule a follow-up appointment in: pending  Any Other Special Instructions Will Be Listed Below (If Applicable).  If you need a refill on your cardiac medications before your next appointment, please call your pharmacy.

## 2021-02-12 NOTE — Telephone Encounter (Signed)
Checking percert on the following patient for testing scheduled at [facility].     2 D Echo dx: DOE & fatigue Lexiscan dx: DOE & fatigue  03/07/2021

## 2021-02-25 ENCOUNTER — Encounter: Payer: Self-pay | Admitting: Internal Medicine

## 2021-03-07 ENCOUNTER — Other Ambulatory Visit (HOSPITAL_COMMUNITY)
Admission: RE | Admit: 2021-03-07 | Discharge: 2021-03-07 | Disposition: A | Discharge status: Home / Self Care | Payer: Medicare Other | Source: Ambulatory Visit | Attending: Cardiology | Admitting: Cardiology

## 2021-03-07 ENCOUNTER — Encounter (HOSPITAL_COMMUNITY)
Admission: RE | Admit: 2021-03-07 | Discharge: 2021-03-07 | Disposition: A | Discharge status: Home / Self Care | Payer: Medicare Other | Source: Ambulatory Visit | Attending: Cardiology | Admitting: Cardiology

## 2021-03-07 ENCOUNTER — Encounter (HOSPITAL_COMMUNITY): Payer: Self-pay

## 2021-03-07 ENCOUNTER — Other Ambulatory Visit: Payer: Self-pay

## 2021-03-07 ENCOUNTER — Ambulatory Visit (HOSPITAL_COMMUNITY)
Admission: RE | Admit: 2021-03-07 | Discharge: 2021-03-07 | Disposition: A | Discharge status: Home / Self Care | Payer: Medicare Other | Source: Ambulatory Visit | Attending: Cardiology | Admitting: Cardiology

## 2021-03-07 ENCOUNTER — Ambulatory Visit (HOSPITAL_BASED_OUTPATIENT_CLINIC_OR_DEPARTMENT_OTHER)
Admission: RE | Admit: 2021-03-07 | Discharge: 2021-03-07 | Disposition: A | Discharge status: Home / Self Care | Payer: Medicare Other | Source: Ambulatory Visit | Attending: Cardiology | Admitting: Cardiology

## 2021-03-07 DIAGNOSIS — R0609 Other forms of dyspnea: Secondary | ICD-10-CM

## 2021-03-07 DIAGNOSIS — R5383 Other fatigue: Secondary | ICD-10-CM

## 2021-03-07 DIAGNOSIS — R0600 Dyspnea, unspecified: Secondary | ICD-10-CM | POA: Insufficient documentation

## 2021-03-07 DIAGNOSIS — I10 Essential (primary) hypertension: Secondary | ICD-10-CM

## 2021-03-07 HISTORY — DX: Unspecified asthma, uncomplicated: J45.909

## 2021-03-07 LAB — CBC
HCT: 37.1 % (ref 36.0–46.0)
Hemoglobin: 11.6 g/dL — ABNORMAL LOW (ref 12.0–15.0)
MCH: 28.9 pg (ref 26.0–34.0)
MCHC: 31.3 g/dL (ref 30.0–36.0)
MCV: 92.5 fL (ref 80.0–100.0)
Platelets: 368 10*3/uL (ref 150–400)
RBC: 4.01 MIL/uL (ref 3.87–5.11)
RDW: 15.8 % — ABNORMAL HIGH (ref 11.5–15.5)
WBC: 9.6 10*3/uL (ref 4.0–10.5)
nRBC: 0 % (ref 0.0–0.2)

## 2021-03-07 LAB — NM MYOCAR MULTI W/SPECT W/WALL MOTION / EF
LV dias vol: 87 mL (ref 46–106)
LV sys vol: 26 mL
Nuc Stress EF: 53 %
Peak HR: 94 {beats}/min
RATE: 0.3
Rest HR: 67 {beats}/min
Rest Nuclear Isotope Dose: 10.1 mCi
SDS: 0
SRS: 2
SSS: 2
ST Depression (mm): 0 mm
Stress Nuclear Isotope Dose: 30.9 mCi
TID: 1.11

## 2021-03-07 LAB — BASIC METABOLIC PANEL
Anion gap: 7 (ref 5–15)
BUN: 28 mg/dL — ABNORMAL HIGH (ref 8–23)
CO2: 23 mmol/L (ref 22–32)
Calcium: 9.3 mg/dL (ref 8.9–10.3)
Chloride: 109 mmol/L (ref 98–111)
Creatinine, Ser: 1.48 mg/dL — ABNORMAL HIGH (ref 0.44–1.00)
GFR, Estimated: 38 mL/min — ABNORMAL LOW (ref >60)
Glucose, Bld: 257 mg/dL — ABNORMAL HIGH (ref 70–99)
Potassium: 3.8 mmol/L (ref 3.5–5.1)
Sodium: 139 mmol/L (ref 135–145)

## 2021-03-07 LAB — ECHOCARDIOGRAM COMPLETE
Area-P 1/2: 5.02 cm2
S' Lateral: 2.5 cm

## 2021-03-07 LAB — TSH: TSH: 2.387 u[IU]/mL (ref 0.350–4.500)

## 2021-03-07 MED ORDER — SODIUM CHLORIDE FLUSH 0.9 % IV SOLN
INTRAVENOUS | Status: AC
  Administered 2021-03-07: 10 mL via INTRAVENOUS
  Filled 2021-03-07: qty 10

## 2021-03-07 MED ORDER — TECHNETIUM TC 99M TETROFOSMIN IV KIT
30.0000 | PACK | Freq: Once | INTRAVENOUS | Status: AC | PRN
  Administered 2021-03-07: 30.9 via INTRAVENOUS

## 2021-03-07 MED ORDER — TECHNETIUM TC 99M TETROFOSMIN IV KIT
10.0000 | PACK | Freq: Once | INTRAVENOUS | Status: AC | PRN
  Administered 2021-03-07: 10.1 via INTRAVENOUS

## 2021-03-07 MED ORDER — REGADENOSON 0.4 MG/5ML IV SOLN
INTRAVENOUS | Status: AC
  Administered 2021-03-07: 0.4 mg via INTRAVENOUS
  Filled 2021-03-07: qty 5

## 2021-03-07 NOTE — Progress Notes (Signed)
  Echocardiogram 2D Echocardiogram has been performed.  Katherine Stokes 03/07/2021, 12:53 PM

## 2021-03-20 DIAGNOSIS — R0789 Other chest pain: Secondary | ICD-10-CM | POA: Diagnosis not present

## 2021-03-20 DIAGNOSIS — J4540 Moderate persistent asthma, uncomplicated: Secondary | ICD-10-CM | POA: Diagnosis not present

## 2021-03-20 DIAGNOSIS — R791 Abnormal coagulation profile: Secondary | ICD-10-CM | POA: Diagnosis not present

## 2021-03-26 ENCOUNTER — Telehealth: Payer: Self-pay | Admitting: *Deleted

## 2021-03-26 DIAGNOSIS — K449 Diaphragmatic hernia without obstruction or gangrene: Secondary | ICD-10-CM | POA: Diagnosis not present

## 2021-03-26 DIAGNOSIS — I700 Atherosclerosis of aorta: Secondary | ICD-10-CM | POA: Diagnosis not present

## 2021-03-26 DIAGNOSIS — R0602 Shortness of breath: Secondary | ICD-10-CM | POA: Diagnosis not present

## 2021-03-26 DIAGNOSIS — R079 Chest pain, unspecified: Secondary | ICD-10-CM | POA: Diagnosis not present

## 2021-03-26 DIAGNOSIS — I2584 Coronary atherosclerosis due to calcified coronary lesion: Secondary | ICD-10-CM | POA: Diagnosis not present

## 2021-03-26 DIAGNOSIS — K8020 Calculus of gallbladder without cholecystitis without obstruction: Secondary | ICD-10-CM | POA: Diagnosis not present

## 2021-03-26 DIAGNOSIS — I2510 Atherosclerotic heart disease of native coronary artery without angina pectoris: Secondary | ICD-10-CM | POA: Diagnosis not present

## 2021-03-26 DIAGNOSIS — R9389 Abnormal findings on diagnostic imaging of other specified body structures: Secondary | ICD-10-CM | POA: Diagnosis not present

## 2021-03-26 NOTE — Telephone Encounter (Signed)
Had Chest CT done at [facility] today and Aortic Atherosclerosis (ICD10-I70.0) including at least 3 vessel  coronary artery calcifications was found. Patient requesting this be reviewed by provider.

## 2021-03-26 NOTE — Telephone Encounter (Signed)
Patient informed and and did not understand. Says she will call office tomorrow so daughter can listen to response.

## 2021-03-31 NOTE — Telephone Encounter (Signed)
Will forward to Hasanj's office as an [place]

## 2021-04-03 ENCOUNTER — Other Ambulatory Visit: Payer: Self-pay

## 2021-04-03 ENCOUNTER — Inpatient Hospital Stay (HOSPITAL_COMMUNITY)
Admission: EM | Admit: 2021-04-03 | Discharge: 2021-04-05 | DRG: 641 | Disposition: A | Discharge status: Home / Self Care | Payer: Medicare Other | Source: Ambulatory Visit | Attending: Family Medicine | Admitting: Family Medicine

## 2021-04-03 ENCOUNTER — Encounter: Payer: Self-pay | Admitting: General Surgery

## 2021-04-03 ENCOUNTER — Emergency Department (HOSPITAL_COMMUNITY): Payer: Medicare Other

## 2021-04-03 ENCOUNTER — Encounter (HOSPITAL_COMMUNITY): Payer: Self-pay | Admitting: *Deleted

## 2021-04-03 ENCOUNTER — Ambulatory Visit (INDEPENDENT_AMBULATORY_CARE_PROVIDER_SITE_OTHER): Payer: Medicare Other | Admitting: General Surgery

## 2021-04-03 VITALS — BP 114/74 | HR 67 | Temp 97.6°F | Resp 16 | Ht 62.0 in | Wt 219.0 lb

## 2021-04-03 DIAGNOSIS — K449 Diaphragmatic hernia without obstruction or gangrene: Secondary | ICD-10-CM | POA: Diagnosis present

## 2021-04-03 DIAGNOSIS — Z7982 Long term (current) use of aspirin: Secondary | ICD-10-CM

## 2021-04-03 DIAGNOSIS — K8020 Calculus of gallbladder without cholecystitis without obstruction: Secondary | ICD-10-CM

## 2021-04-03 DIAGNOSIS — K219 Gastro-esophageal reflux disease without esophagitis: Secondary | ICD-10-CM | POA: Diagnosis present

## 2021-04-03 DIAGNOSIS — R0602 Shortness of breath: Secondary | ICD-10-CM | POA: Diagnosis not present

## 2021-04-03 DIAGNOSIS — Z20822 Contact with and (suspected) exposure to covid-19: Secondary | ICD-10-CM | POA: Diagnosis present

## 2021-04-03 DIAGNOSIS — R0600 Dyspnea, unspecified: Secondary | ICD-10-CM | POA: Diagnosis not present

## 2021-04-03 DIAGNOSIS — Z800 Family history of malignant neoplasm of digestive organs: Secondary | ICD-10-CM

## 2021-04-03 DIAGNOSIS — E782 Mixed hyperlipidemia: Secondary | ICD-10-CM | POA: Diagnosis present

## 2021-04-03 DIAGNOSIS — I700 Atherosclerosis of aorta: Secondary | ICD-10-CM | POA: Diagnosis not present

## 2021-04-03 DIAGNOSIS — R109 Unspecified abdominal pain: Secondary | ICD-10-CM | POA: Diagnosis present

## 2021-04-03 DIAGNOSIS — Z882 Allergy status to sulfonamides status: Secondary | ICD-10-CM

## 2021-04-03 DIAGNOSIS — J45909 Unspecified asthma, uncomplicated: Secondary | ICD-10-CM | POA: Diagnosis present

## 2021-04-03 DIAGNOSIS — K921 Melena: Secondary | ICD-10-CM | POA: Diagnosis present

## 2021-04-03 DIAGNOSIS — E860 Dehydration: Principal | ICD-10-CM | POA: Diagnosis present

## 2021-04-03 DIAGNOSIS — D649 Anemia, unspecified: Secondary | ICD-10-CM

## 2021-04-03 DIAGNOSIS — K317 Polyp of stomach and duodenum: Secondary | ICD-10-CM | POA: Diagnosis present

## 2021-04-03 DIAGNOSIS — Z6841 Body Mass Index (BMI) 40.0 and over, adult: Secondary | ICD-10-CM

## 2021-04-03 DIAGNOSIS — Z880 Allergy status to penicillin: Secondary | ICD-10-CM

## 2021-04-03 DIAGNOSIS — R0609 Other forms of dyspnea: Secondary | ICD-10-CM | POA: Diagnosis present

## 2021-04-03 DIAGNOSIS — D638 Anemia in other chronic diseases classified elsewhere: Secondary | ICD-10-CM | POA: Diagnosis present

## 2021-04-03 DIAGNOSIS — I69341 Monoplegia of lower limb following cerebral infarction affecting right dominant side: Secondary | ICD-10-CM

## 2021-04-03 DIAGNOSIS — I129 Hypertensive chronic kidney disease with stage 1 through stage 4 chronic kidney disease, or unspecified chronic kidney disease: Secondary | ICD-10-CM | POA: Diagnosis present

## 2021-04-03 DIAGNOSIS — N179 Acute kidney failure, unspecified: Secondary | ICD-10-CM | POA: Diagnosis not present

## 2021-04-03 DIAGNOSIS — E1122 Type 2 diabetes mellitus with diabetic chronic kidney disease: Secondary | ICD-10-CM | POA: Diagnosis not present

## 2021-04-03 DIAGNOSIS — D509 Iron deficiency anemia, unspecified: Secondary | ICD-10-CM | POA: Diagnosis not present

## 2021-04-03 DIAGNOSIS — Z794 Long term (current) use of insulin: Secondary | ICD-10-CM

## 2021-04-03 DIAGNOSIS — I10 Essential (primary) hypertension: Secondary | ICD-10-CM | POA: Diagnosis present

## 2021-04-03 DIAGNOSIS — E119 Type 2 diabetes mellitus without complications: Secondary | ICD-10-CM

## 2021-04-03 DIAGNOSIS — Z90710 Acquired absence of both cervix and uterus: Secondary | ICD-10-CM | POA: Diagnosis not present

## 2021-04-03 DIAGNOSIS — K222 Esophageal obstruction: Secondary | ICD-10-CM | POA: Diagnosis not present

## 2021-04-03 DIAGNOSIS — R079 Chest pain, unspecified: Secondary | ICD-10-CM | POA: Diagnosis not present

## 2021-04-03 DIAGNOSIS — R7989 Other specified abnormal findings of blood chemistry: Secondary | ICD-10-CM | POA: Diagnosis not present

## 2021-04-03 DIAGNOSIS — R1310 Dysphagia, unspecified: Secondary | ICD-10-CM

## 2021-04-03 DIAGNOSIS — Z79899 Other long term (current) drug therapy: Secondary | ICD-10-CM

## 2021-04-03 DIAGNOSIS — N182 Chronic kidney disease, stage 2 (mild): Secondary | ICD-10-CM | POA: Diagnosis present

## 2021-04-03 DIAGNOSIS — R0781 Pleurodynia: Secondary | ICD-10-CM | POA: Diagnosis not present

## 2021-04-03 DIAGNOSIS — R1319 Other dysphagia: Secondary | ICD-10-CM

## 2021-04-03 LAB — COMPREHENSIVE METABOLIC PANEL
ALT: 16 U/L (ref 0–44)
AST: 15 U/L (ref 15–41)
Albumin: 3.8 g/dL (ref 3.5–5.0)
Alkaline Phosphatase: 57 U/L (ref 38–126)
Anion gap: 9 (ref 5–15)
BUN: 36 mg/dL — ABNORMAL HIGH (ref 8–23)
CO2: 20 mmol/L — ABNORMAL LOW (ref 22–32)
Calcium: 9.5 mg/dL (ref 8.9–10.3)
Chloride: 110 mmol/L (ref 98–111)
Creatinine, Ser: 2.44 mg/dL — ABNORMAL HIGH (ref 0.44–1.00)
GFR, Estimated: 21 mL/min — ABNORMAL LOW (ref >60)
Glucose, Bld: 139 mg/dL — ABNORMAL HIGH (ref 70–99)
Potassium: 4.3 mmol/L (ref 3.5–5.1)
Sodium: 139 mmol/L (ref 135–145)
Total Bilirubin: 0.5 mg/dL (ref 0.3–1.2)
Total Protein: 8.1 g/dL (ref 6.5–8.1)

## 2021-04-03 LAB — URINALYSIS, ROUTINE W REFLEX MICROSCOPIC
Bacteria, UA: NONE SEEN
Bilirubin Urine: NEGATIVE
Glucose, UA: >=500 mg/dL — AB
Hgb urine dipstick: NEGATIVE
Ketones, ur: NEGATIVE mg/dL
Leukocytes,Ua: NEGATIVE
Nitrite: NEGATIVE
Protein, ur: NEGATIVE mg/dL
Specific Gravity, Urine: 1.015 (ref 1.005–1.030)
pH: 5 (ref 5.0–8.0)

## 2021-04-03 LAB — CBC
HCT: 31.7 % — ABNORMAL LOW (ref 36.0–46.0)
Hemoglobin: 9.9 g/dL — ABNORMAL LOW (ref 12.0–15.0)
MCH: 29 pg (ref 26.0–34.0)
MCHC: 31.2 g/dL (ref 30.0–36.0)
MCV: 93 fL (ref 80.0–100.0)
Platelets: 403 10*3/uL — ABNORMAL HIGH (ref 150–400)
RBC: 3.41 MIL/uL — ABNORMAL LOW (ref 3.87–5.11)
RDW: 15.9 % — ABNORMAL HIGH (ref 11.5–15.5)
WBC: 11.7 10*3/uL — ABNORMAL HIGH (ref 4.0–10.5)
nRBC: 0 % (ref 0.0–0.2)

## 2021-04-03 LAB — LIPASE, BLOOD: Lipase: 23 U/L (ref 11–51)

## 2021-04-03 LAB — RESP PANEL BY RT-PCR (FLU A&B, COVID) ARPGX2
Influenza A by PCR: NEGATIVE
Influenza B by PCR: NEGATIVE
SARS Coronavirus 2 by RT PCR: NEGATIVE

## 2021-04-03 LAB — BRAIN NATRIURETIC PEPTIDE: B Natriuretic Peptide: 26 pg/mL (ref 0.0–100.0)

## 2021-04-03 LAB — TROPONIN I (HIGH SENSITIVITY)
Troponin I (High Sensitivity): 5 ng/L (ref <18)
Troponin I (High Sensitivity): 5 ng/L (ref <18)

## 2021-04-03 MED ORDER — SODIUM CHLORIDE 0.9 % IV BOLUS
1000.0000 mL | Freq: Once | INTRAVENOUS | Status: AC
  Administered 2021-04-03: 1000 mL via INTRAVENOUS

## 2021-04-03 MED ORDER — OXYCODONE HCL 5 MG PO TABS
5.0000 mg | ORAL_TABLET | ORAL | Status: DC | PRN
  Administered 2021-04-03 – 2021-04-05 (×3): 5 mg via ORAL
  Filled 2021-04-03 (×3): qty 1

## 2021-04-03 MED ORDER — IPRATROPIUM-ALBUTEROL 0.5-2.5 (3) MG/3ML IN SOLN
3.0000 mL | Freq: Once | RESPIRATORY_TRACT | Status: AC
  Administered 2021-04-03: 3 mL via RESPIRATORY_TRACT
  Filled 2021-04-03: qty 3

## 2021-04-03 MED ORDER — HYDROMORPHONE HCL 1 MG/ML IJ SOLN: 0.5000 mg | Freq: Once | INTRAMUSCULAR | Status: DC

## 2021-04-03 MED ORDER — LIDOCAINE VISCOUS HCL 2 % MT SOLN
15.0000 mL | Freq: Once | OROMUCOSAL | Status: AC
  Administered 2021-04-03: 15 mL via ORAL
  Filled 2021-04-03: qty 15

## 2021-04-03 MED ORDER — TECHNETIUM TO 99M ALBUMIN AGGREGATED
4.3000 | Freq: Once | INTRAVENOUS | Status: AC | PRN
  Administered 2021-04-03: 4.3 via INTRAVENOUS

## 2021-04-03 MED ORDER — ALUM & MAG HYDROXIDE-SIMETH 200-200-20 MG/5ML PO SUSP
30.0000 mL | Freq: Once | ORAL | Status: AC
  Administered 2021-04-03: 30 mL via ORAL
  Filled 2021-04-03: qty 30

## 2021-04-03 MED ORDER — HYDROCODONE-ACETAMINOPHEN 5-325 MG PO TABS
1.0000 | ORAL_TABLET | Freq: Once | ORAL | Status: AC
Start: 2021-04-03 — End: 2021-04-03
  Administered 2021-04-03: 1 via ORAL
  Filled 2021-04-03: qty 1

## 2021-04-03 NOTE — ED Notes (Signed)
Placed on purewick. Told to call when finished

## 2021-04-03 NOTE — Progress Notes (Signed)
Katherine Stokes; [id number]; Nov 02, 1950   HPI Patient is a 70 year old black female who was referred to my care by Dr. Sherrie Sport for evaluation and treatment of cholelithiasis.  Gallstones were seen on CT scan of the chest for work-up of shortness of breath and right-sided chest pain.  Patient has multiple complaints.  Primarily, she has pain with movement and touching of the posterior aspect of her right axilla.  It seems to be localized to the chest wall.  It is not associated with food.  She denies any right upper quadrant abdominal pain or nausea.  She is also having swallowing difficulties and occasionally she cannot swallow liquids or solids.  She has been started on a PPI for this.  She is scheduled to see gastroenterology in January 2023.  She has had a cardiology work-up which was negative.  Her ejection fraction was normal and her stress test was within normal limits.  She is seeing a pulmonologist next month.  She denies any fever, chills, or jaundice.  She denies any fatty food intolerance.  Her appetite is decreased secondary to her swallowing difficulties. Past Medical History:  Diagnosis Date   Asthma    Chronic kidney disease (CKD), stage II (mild)    Essential hypertension    GERD (gastroesophageal reflux disease)    History of stroke    Residual right leg weakness   Mixed hyperlipidemia    Type 2 diabetes mellitus (HCC)     Past Surgical History:  Procedure Laterality Date   ABDOMINAL HYSTERECTOMY     TONSILLECTOMY      Family History  Problem Relation Age of Onset   Dementia Mother    Colon cancer Father     Current Outpatient Medications on File Prior to Visit  Medication Sig Dispense Refill   albuterol (PROVENTIL) (2.5 MG/3ML) 0.083% nebulizer solution Take 2.5 mg by nebulization every 6 (six) hours as needed for wheezing or shortness of breath.     albuterol (VENTOLIN HFA) 108 (90 Base) MCG/ACT inhaler Inhale 2 puffs into the lungs every 6 (six) hours as needed  for wheezing or shortness of breath.     allopurinol (ZYLOPRIM) 300 MG tablet Take 300 mg by mouth daily.     amLODipine (NORVASC) 5 MG tablet Take 5 mg by mouth daily.     aspirin EC 81 MG tablet Take 162 mg by mouth daily. Swallow whole.     benazepril-hydrochlorthiazide (LOTENSIN HCT) 20-25 MG tablet Take 1 tablet by mouth daily.     carvedilol (COREG) 25 MG tablet Take 25 mg by mouth 2 (two) times daily with a meal.     cholecalciferol (VITAMIN D) 25 MCG (1000 UNIT) tablet Take 1,000 Units by mouth daily.     diclofenac Sodium (VOLTAREN) 1 % GEL Apply 2 g topically 4 (four) times daily.     famotidine (PEPCID) 40 MG tablet Take 40 mg by mouth daily.     gemfibrozil (LOPID) 600 MG tablet Take 600 mg by mouth 2 (two) times daily before a meal.     insulin aspart (NOVOLOG FLEXPEN) 100 UNIT/ML FlexPen Inject 25 Units into the skin 3 (three) times daily with meals.     insulin glargine (LANTUS SOLOSTAR) 100 UNIT/ML Solostar Pen Inject 40 Units into the skin 2 (two) times daily.     latanoprost (XALATAN) 0.005 % ophthalmic solution Place 1 drop into both eyes at bedtime.     Omega-3 Fatty Acids (FISH OIL) 1000 MG CAPS Take 1 capsule by mouth  3 (three) times daily.     pantoprazole (PROTONIX) 40 MG tablet Take 40 mg by mouth daily.     sodium bicarbonate 650 MG tablet Take 650 mg by mouth 3 (three) times daily.     sucralfate (CARAFATE) 1 GM/10ML suspension Take 1 g by mouth 4 (four) times daily -  with meals and at bedtime.     vitamin B-12 (CYANOCOBALAMIN) 500 MCG tablet Take 500 mcg by mouth daily.     No current facility-administered medications on file prior to visit.    Allergies  Allergen Reactions   Penicillins     REACTION: rash/swelling   Sulfa Antibiotics Rash    Social History   Substance and Sexual Activity  Alcohol Use Never    Social History   Tobacco Use  Smoking Status Never  Smokeless Tobacco Never    Review of Systems  Constitutional: Negative.   HENT:  Negative.    Eyes:  Positive for blurred vision.  Respiratory:  Positive for shortness of breath.   Cardiovascular:  Positive for chest pain.  Gastrointestinal:  Positive for heartburn and nausea.  Genitourinary: Negative.   Skin: Negative.   Neurological:  Positive for dizziness.  Endo/Heme/Allergies: Negative.   Psychiatric/Behavioral: Negative.     Objective   Vitals:   04/03/21 1107  BP: 114/74  Pulse: 67  Resp: 16  Temp: 97.6 F (36.4 C)  SpO2: 96%    Physical Exam Vitals reviewed.  Constitutional:      Appearance: Normal appearance. She is normal weight. She is not ill-appearing.  HENT:     Head: Normocephalic and atraumatic.  Eyes:     General: No scleral icterus. Cardiovascular:     Rate and Rhythm: Normal rate and regular rhythm.     Heart sounds: Normal heart sounds. No murmur heard.   No friction rub. No gallop.  Pulmonary:     Effort: Pulmonary effort is normal. No respiratory distress.     Breath sounds: Normal breath sounds. No stridor. No wheezing, rhonchi or rales.     Comments: Patient has exquisite chest wall tenderness in the right posterior axillary region. Abdominal:     General: Bowel sounds are normal. There is no distension.     Palpations: Abdomen is soft. There is no mass.     Tenderness: There is no abdominal tenderness. There is no guarding or rebound.     Hernia: No hernia is present.  Skin:    General: Skin is warm and dry.  Neurological:     Mental Status: She is alert and oriented to person, place, and time.   CT scan of the chest report reviewed.  No laboratory test available.  Cardiology note reviewed. Assessment  Cholelithiasis. Right-sided chest wall pain, dysphagia, shortness of breath on exertion. I do not feel that her gallstones are causing her clinical symptoms.  I do not have her laboratory test results, but she has no symptoms of biliary colic. Plan  I explained to the patient and family member that cholecystectomy is  not indicated at this time.  They understand and agree.  I told him to keep their appointments with both GI and pulmonology as these seem to be more pressing in nature.  They understand and agree.  Follow-up here as needed.  Even if I wanted to do surgery, she would need clearance from pulmonology.

## 2021-04-03 NOTE — ED Triage Notes (Signed)
Shortness of breath, right lateral rib cage pain, swallowing problem x 2 months

## 2021-04-03 NOTE — ED Notes (Signed)
Encouraged pt to give sample; provider ordered in and out cath. Pt states she used to restroom soon before coming to this room.

## 2021-04-03 NOTE — ED Notes (Signed)
Pt transported to nuclear medicine. 

## 2021-04-03 NOTE — ED Provider Notes (Signed)
[facility] EMERGENCY DEPARTMENT Provider Note   CSN: [id number] Arrival date & time: 04/03/21  1139     History Chief Complaint  Patient presents with   Shortness of Breath    Katherine Stokes is a 70 y.o. female.  HPI  70 year old female with a history of asthma, CKD, hypertension, GERD, CVA, hyperlipidemia, diabetes, who presents to the emergency department today for evaluation of multiple complaints.  Her daughter is at bedside and assists with the history.  She states that the patient has had myriad of symptoms over the last 3 months.  Symptoms include shortness of breath, decreased appetite, dysphagia and a feeling of food getting stuck in her throat with swallowing.  She is also had pain to the right mid back/flank area.  She has followed with her PCP who ultimately referred her to cardiology.  Her cardiac work-up (chemical stress) was reassuring and she later followed up at [facility] who did a CT scan and found gallstones.  She was referred to general surgery and saw Dr. Arnoldo Morale this morning who did not feel that her symptoms are related to gallstones and sent her to the ED for further evaluation.  Past Medical History:  Diagnosis Date   Asthma    Chronic kidney disease (CKD), stage II (mild)    Essential hypertension    GERD (gastroesophageal reflux disease)    History of stroke    Residual right leg weakness   Mixed hyperlipidemia    Type 2 diabetes mellitus ([place])     Patient Active Problem List   Diagnosis Date Noted   AKI (acute kidney injury) ([place]) 04/03/2021   MORBID OBESITY 12/12/2008   DIABETES MELLITUS, TYPE II, UNCONTROLLED, W/VASCULAR COMPS 09/12/2008   CEREBROVASCULAR ACCIDENT 09/12/2008   SHOULDER PAIN, RIGHT 08/30/2008   NUMBNESS 06/05/2008   ASCUS PAP 08/01/2007   Essential hypertension 07/18/2007   ASTHMA 07/18/2007   ARTHRITIS 07/18/2007   KNEE PAIN, RIGHT, CHRONIC 07/18/2007    Past Surgical History:  Procedure Laterality Date    ABDOMINAL HYSTERECTOMY     TONSILLECTOMY       OB History   No obstetric history on file.     Family History  Problem Relation Age of Onset   Dementia Mother    Colon cancer Father     Social History   Tobacco Use   Smoking status: Never   Smokeless tobacco: Never  Substance Use Topics   Alcohol use: Never   Drug use: Never    Home Medications Prior to Admission medications   Medication Sig Start Date End Date Taking? Authorizing Provider  albuterol (PROVENTIL) (2.5 MG/3ML) 0.083% nebulizer solution Take 2.5 mg by nebulization every 6 (six) hours as needed for wheezing or shortness of breath.    [provider]  albuterol (VENTOLIN HFA) 108 (90 Base) MCG/ACT inhaler Inhale 2 puffs into the lungs every 6 (six) hours as needed for wheezing or shortness of breath.    [provider]  allopurinol (ZYLOPRIM) 300 MG tablet Take 300 mg by mouth daily.    [provider]  amLODipine (NORVASC) 5 MG tablet Take 5 mg by mouth daily.    [provider]  aspirin EC 81 MG tablet Take 162 mg by mouth daily. Swallow whole.    [provider]  benazepril-hydrochlorthiazide (LOTENSIN HCT) 20-25 MG tablet Take 1 tablet by mouth daily.    [provider]  carvedilol (COREG) 25 MG tablet Take 25 mg by mouth 2 (two) times daily  with a meal.    [provider]  cholecalciferol (VITAMIN D) 25 MCG (1000 UNIT) tablet Take 1,000 Units by mouth daily.    [provider]  diclofenac Sodium (VOLTAREN) 1 % GEL Apply 2 g topically 4 (four) times daily.    [provider]  famotidine (PEPCID) 40 MG tablet Take 40 mg by mouth daily.    [provider]  gemfibrozil (LOPID) 600 MG tablet Take 600 mg by mouth 2 (two) times daily before a meal.    [provider]  insulin aspart (NOVOLOG FLEXPEN) 100 UNIT/ML FlexPen Inject 25 Units into the skin 3 (three) times daily with meals.    [provider]  insulin  glargine (LANTUS SOLOSTAR) 100 UNIT/ML Solostar Pen Inject 40 Units into the skin 2 (two) times daily.    [provider]  latanoprost (XALATAN) 0.005 % ophthalmic solution Place 1 drop into both eyes at bedtime. 02/08/21   [provider]  Omega-3 Fatty Acids (FISH OIL) 1000 MG CAPS Take 1 capsule by mouth 3 (three) times daily.    [provider]  pantoprazole (PROTONIX) 40 MG tablet Take 40 mg by mouth daily.    [provider]  sodium bicarbonate 650 MG tablet Take 650 mg by mouth 3 (three) times daily.    [provider]  sucralfate (CARAFATE) 1 GM/10ML suspension Take 1 g by mouth 4 (four) times daily -  with meals and at bedtime.    [provider]  vitamin B-12 (CYANOCOBALAMIN) 500 MCG tablet Take 500 mcg by mouth daily.    [provider]    Allergies    Penicillins and Sulfa antibiotics  Review of Systems   Review of Systems  Constitutional:  Positive for appetite change. Negative for chills and fever.  HENT:  Positive for trouble swallowing. Negative for ear pain and sore throat.   Eyes:  Negative for visual disturbance.  Respiratory:  Positive for shortness of breath. Negative for cough.   Cardiovascular:  Negative for chest pain and leg swelling.  Gastrointestinal:  Negative for abdominal pain, constipation, diarrhea, nausea and vomiting.  Genitourinary:  Negative for dysuria and hematuria.  Musculoskeletal:  Positive for back pain.  Skin:  Negative for color change and rash.  Neurological:  Negative for headaches.  All other systems reviewed and are negative.  Physical Exam Updated Vital Signs BP 135/69   Pulse 71   Temp 97.8 F (36.6 C) (Oral)   Resp 14   SpO2 96%   Physical Exam Vitals and nursing note reviewed.  Constitutional:      General: She is not in acute distress.    Appearance: She is well-developed.  HENT:     Head: Normocephalic and atraumatic.  Eyes:     Conjunctiva/sclera:  Conjunctivae normal.  Cardiovascular:     Rate and Rhythm: Normal rate and regular rhythm.     Heart sounds: No murmur heard. Pulmonary:     Effort: Pulmonary effort is normal. No respiratory distress.     Breath sounds: Normal breath sounds. No wheezing, rhonchi or rales.  Chest:     Chest wall: Tenderness (TTP to the right posterolateral ribs which reproduces pain) present.  Abdominal:     General: Bowel sounds are normal.     Palpations: Abdomen is soft.     Tenderness: There is no abdominal tenderness. There is left CVA tenderness. There is no right CVA tenderness.  Musculoskeletal:        General: No  tenderness.     Cervical back: Neck supple.     Right lower leg: No edema.     Left lower leg: No edema.  Skin:    General: Skin is warm and dry.  Neurological:     Mental Status: She is alert.    ED Results / Procedures / Treatments   Labs (all labs ordered are listed, but only abnormal results are displayed) Labs Reviewed  COMPREHENSIVE METABOLIC PANEL - Abnormal; Notable for the following components:      Result Value   CO2 20 (*)    Glucose, Bld 139 (*)    BUN 36 (*)    Creatinine, Ser 2.44 (*)    GFR, Estimated 21 (*)    All other components within normal limits  CBC - Abnormal; Notable for the following components:   WBC 11.7 (*)    RBC 3.41 (*)    Hemoglobin 9.9 (*)    HCT 31.7 (*)    RDW 15.9 (*)    Platelets 403 (*)    All other components within normal limits  LIPASE, BLOOD  BRAIN NATRIURETIC PEPTIDE  URINALYSIS, ROUTINE W REFLEX MICROSCOPIC  TROPONIN I (HIGH SENSITIVITY)  TROPONIN I (HIGH SENSITIVITY)    EKG EKG Interpretation  Date/Time:  Thursday April 03 2021 16:53:10 EDT Ventricular Rate:  72 PR Interval:  200 QRS Duration: 74 QT Interval:  394 QTC Calculation: 431 R Axis:   18 Text Interpretation: Normal sinus rhythm Normal ECG Confirmed by Nanda Quinton [phone]) on 04/03/2021 4:55:50 PM  Radiology CT ABDOMEN PELVIS WO  CONTRAST  Result Date: 04/03/2021 CLINICAL DATA:  Flank pain, kidney stone suspected EXAM: CT ABDOMEN AND PELVIS WITHOUT CONTRAST TECHNIQUE: Multidetector CT imaging of the abdomen and pelvis was performed following the standard protocol without IV contrast. COMPARISON:  None. FINDINGS: Lower chest: Lung bases are clear.  Small hiatal hernia. Hepatobiliary: No focal liver abnormality. Gallstones are present. No biliary dilatation. Pancreas: Unremarkable. Spleen: Unremarkable. Adrenals/Urinary Tract: Adrenals are unremarkable. There is no hydronephrosis. No renal calculi. Bladder is unremarkable. Stomach/Bowel: Stomach is within normal limits apart from hiatal hernia. Bowel is normal in caliber. Vascular/Lymphatic: Aortic atherosclerosis. No enlarged lymph nodes. Reproductive: Status post hysterectomy. No adnexal masses. Other: No free fluid.  Abdominal wall is unremarkable. Musculoskeletal: Degenerative changes of the included spine. IMPRESSION: No urinary tract calculi. Cholelithiasis. Small hiatal hernia. Aortic atherosclerosis. Electronically Signed   By: Macy Mis M.D.   On: 04/03/2021 16:31   DG Chest 2 View  Result Date: 04/03/2021 CLINICAL DATA:  Short of breath.  Right rib pain. EXAM: CHEST - 2 VIEW COMPARISON:  Chest CT 03/26/2021 FINDINGS: The heart size and mediastinal contours are within normal limits. Both lungs are clear. The visualized skeletal structures are unremarkable. IMPRESSION: No active cardiopulmonary disease. Electronically Signed   By: Franchot Gallo M.D.   On: 04/03/2021 15:48   NM Pulmonary Perfusion  Result Date: 04/03/2021 CLINICAL DATA:  PE suspected, low/intermediate prob, positive D-dimer. Chest pain and dyspnea for while. EXAM: NUCLEAR MEDICINE PERFUSION LUNG SCAN TECHNIQUE: Perfusion images were obtained in multiple projections after intravenous injection of radiopharmaceutical. Ventilation scans intentionally deferred if perfusion scan and chest x-ray adequate  for interpretation during COVID 19 epidemic. RADIOPHARMACEUTICALS:  4.3 mCi Tc-28mMAA IV COMPARISON:  Chest radiograph from earlier today. FINDINGS: No significant perfusion defects in either lung. IMPRESSION: No scintigraphic evidence of acute pulmonary embolism. Electronically Signed   By: JIlona SorrelM.D.   On: 04/03/2021 19:16  Procedures Procedures   Medications Ordered in ED Medications  HYDROmorphone (DILAUDID) injection 0.5 mg (0 mg Intravenous Hold 04/03/21 1708)  sodium chloride 0.9 % bolus 1,000 mL (has no administration in time range)  ipratropium-albuterol (DUONEB) 0.5-2.5 (3) MG/3ML nebulizer solution 3 mL (3 mLs Nebulization Given 04/03/21 1528)  ipratropium-albuterol (DUONEB) 0.5-2.5 (3) MG/3ML nebulizer solution 3 mL (3 mLs Nebulization Given 04/03/21 1649)  HYDROcodone-acetaminophen (NORCO/VICODIN) 5-325 MG per tablet 1 tablet (1 tablet Oral Given 04/03/21 1712)  alum & mag hydroxide-simeth (MAALOX/MYLANTA) 200-200-20 MG/5ML suspension 30 mL (30 mLs Oral Given 04/03/21 1713)    And  lidocaine (XYLOCAINE) 2 % viscous mouth solution 15 mL (15 mLs Oral Given 04/03/21 1712)  technetium albumin aggregated (MAA) injection solution 4.3 millicurie (4.3 millicuries Intravenous Contrast Given 04/03/21 1828)    ED Course  I have reviewed the triage vital signs and the nursing notes.  Pertinent labs & imaging results that were available during my care of the patient were reviewed by me and considered in my medical decision making (see chart for details).    MDM Rules/Calculators/A&P                          70 y/o female presents to the ED today for eval of sob ongoing to several months   Doubt infectious cause given duration of sxs. Recent cardiac w/u did not reveal ischemia or any signs of heart failure so have lower suspicion for chf or acs. Reviewed care everywhere, CT chest w/o lung masses/nodules, MSK abnormalities, or infectious findings. Will order labs and pursue vq  scan to r/o pe.   Reviewed/interpreted labs CBC with mild leukocytosis, and mild anemia which appears somewhat chronic CMP with elevated BUN/Cr which is significantly worse from prior  - pt started on IVF, likely prerenal   VQ scan neg for pe. CT abd/pelvis is grossly unremarkable.  No obvious cause for her persistent sob but does have outpt pulm f/u scheduled. No emergent cause of sxs identified at this time but she will still require admission for her aki. Aki likely prerenal. Ivf started.   8:03 PM CONSULT with Dr. Mertie Clause who accepts patient for admission  Final Clinical Impression(s) / ED Diagnoses Final diagnoses:  AKI (acute kidney injury) [facility])    Rx / [place] Orders ED Discharge Orders     None        Bishop Dublin 04/03/21 Adria Dill, MD 04/08/21 [phone]

## 2021-04-04 ENCOUNTER — Other Ambulatory Visit: Payer: Self-pay

## 2021-04-04 ENCOUNTER — Encounter (HOSPITAL_COMMUNITY): Payer: Self-pay | Admitting: Family Medicine

## 2021-04-04 ENCOUNTER — Encounter (HOSPITAL_COMMUNITY)
Admission: EM | Disposition: A | Discharge status: Home / Self Care | Payer: Self-pay | Source: Ambulatory Visit | Attending: Family Medicine

## 2021-04-04 ENCOUNTER — Observation Stay (HOSPITAL_COMMUNITY): Payer: Medicare Other | Admitting: Anesthesiology

## 2021-04-04 DIAGNOSIS — D649 Anemia, unspecified: Secondary | ICD-10-CM

## 2021-04-04 DIAGNOSIS — Z20822 Contact with and (suspected) exposure to covid-19: Secondary | ICD-10-CM | POA: Diagnosis not present

## 2021-04-04 DIAGNOSIS — E119 Type 2 diabetes mellitus without complications: Secondary | ICD-10-CM

## 2021-04-04 DIAGNOSIS — Z7984 Long term (current) use of oral hypoglycemic drugs: Secondary | ICD-10-CM | POA: Diagnosis not present

## 2021-04-04 DIAGNOSIS — D509 Iron deficiency anemia, unspecified: Secondary | ICD-10-CM | POA: Diagnosis not present

## 2021-04-04 DIAGNOSIS — Z880 Allergy status to penicillin: Secondary | ICD-10-CM | POA: Diagnosis not present

## 2021-04-04 DIAGNOSIS — Z794 Long term (current) use of insulin: Secondary | ICD-10-CM | POA: Diagnosis not present

## 2021-04-04 DIAGNOSIS — I69341 Monoplegia of lower limb following cerebral infarction affecting right dominant side: Secondary | ICD-10-CM | POA: Diagnosis not present

## 2021-04-04 DIAGNOSIS — K8020 Calculus of gallbladder without cholecystitis without obstruction: Secondary | ICD-10-CM | POA: Diagnosis present

## 2021-04-04 DIAGNOSIS — E1169 Type 2 diabetes mellitus with other specified complication: Secondary | ICD-10-CM | POA: Diagnosis not present

## 2021-04-04 DIAGNOSIS — D638 Anemia in other chronic diseases classified elsewhere: Secondary | ICD-10-CM | POA: Diagnosis present

## 2021-04-04 DIAGNOSIS — R0609 Other forms of dyspnea: Secondary | ICD-10-CM | POA: Diagnosis present

## 2021-04-04 DIAGNOSIS — Z90710 Acquired absence of both cervix and uterus: Secondary | ICD-10-CM | POA: Diagnosis not present

## 2021-04-04 DIAGNOSIS — K317 Polyp of stomach and duodenum: Secondary | ICD-10-CM | POA: Diagnosis present

## 2021-04-04 DIAGNOSIS — Z882 Allergy status to sulfonamides status: Secondary | ICD-10-CM | POA: Diagnosis not present

## 2021-04-04 DIAGNOSIS — R1319 Other dysphagia: Secondary | ICD-10-CM

## 2021-04-04 DIAGNOSIS — N182 Chronic kidney disease, stage 2 (mild): Secondary | ICD-10-CM | POA: Diagnosis present

## 2021-04-04 DIAGNOSIS — R1310 Dysphagia, unspecified: Secondary | ICD-10-CM | POA: Diagnosis not present

## 2021-04-04 DIAGNOSIS — K449 Diaphragmatic hernia without obstruction or gangrene: Secondary | ICD-10-CM | POA: Diagnosis present

## 2021-04-04 DIAGNOSIS — I10 Essential (primary) hypertension: Secondary | ICD-10-CM | POA: Diagnosis not present

## 2021-04-04 DIAGNOSIS — J45909 Unspecified asthma, uncomplicated: Secondary | ICD-10-CM | POA: Diagnosis not present

## 2021-04-04 DIAGNOSIS — K921 Melena: Secondary | ICD-10-CM | POA: Diagnosis not present

## 2021-04-04 DIAGNOSIS — Z800 Family history of malignant neoplasm of digestive organs: Secondary | ICD-10-CM | POA: Diagnosis not present

## 2021-04-04 DIAGNOSIS — Z6841 Body Mass Index (BMI) 40.0 and over, adult: Secondary | ICD-10-CM | POA: Diagnosis not present

## 2021-04-04 DIAGNOSIS — N179 Acute kidney failure, unspecified: Secondary | ICD-10-CM

## 2021-04-04 DIAGNOSIS — E1122 Type 2 diabetes mellitus with diabetic chronic kidney disease: Secondary | ICD-10-CM | POA: Diagnosis not present

## 2021-04-04 DIAGNOSIS — I129 Hypertensive chronic kidney disease with stage 1 through stage 4 chronic kidney disease, or unspecified chronic kidney disease: Secondary | ICD-10-CM | POA: Diagnosis not present

## 2021-04-04 DIAGNOSIS — E860 Dehydration: Secondary | ICD-10-CM | POA: Diagnosis not present

## 2021-04-04 DIAGNOSIS — K222 Esophageal obstruction: Secondary | ICD-10-CM | POA: Diagnosis not present

## 2021-04-04 DIAGNOSIS — Z79899 Other long term (current) drug therapy: Secondary | ICD-10-CM | POA: Diagnosis not present

## 2021-04-04 DIAGNOSIS — E782 Mixed hyperlipidemia: Secondary | ICD-10-CM | POA: Diagnosis present

## 2021-04-04 DIAGNOSIS — E1165 Type 2 diabetes mellitus with hyperglycemia: Secondary | ICD-10-CM | POA: Diagnosis not present

## 2021-04-04 DIAGNOSIS — R109 Unspecified abdominal pain: Secondary | ICD-10-CM | POA: Diagnosis present

## 2021-04-04 DIAGNOSIS — K219 Gastro-esophageal reflux disease without esophagitis: Secondary | ICD-10-CM | POA: Diagnosis present

## 2021-04-04 DIAGNOSIS — R1314 Dysphagia, pharyngoesophageal phase: Secondary | ICD-10-CM | POA: Diagnosis not present

## 2021-04-04 HISTORY — PX: ESOPHAGOGASTRODUODENOSCOPY (EGD) WITH PROPOFOL: SHX5813

## 2021-04-04 HISTORY — PX: ESOPHAGEAL DILATION: SHX303

## 2021-04-04 HISTORY — PX: POLYPECTOMY: SHX5525

## 2021-04-04 LAB — MAGNESIUM: Magnesium: 2.1 mg/dL (ref 1.7–2.4)

## 2021-04-04 LAB — IRON AND TIBC
Iron: 30 ug/dL (ref 28–170)
Saturation Ratios: 10 % — ABNORMAL LOW (ref 10.4–31.8)
TIBC: 295 ug/dL (ref 250–450)
UIBC: 265 ug/dL

## 2021-04-04 LAB — GLUCOSE, CAPILLARY
Glucose-Capillary: 162 mg/dL — ABNORMAL HIGH (ref 70–99)
Glucose-Capillary: 135 mg/dL — ABNORMAL HIGH (ref 70–99)
Glucose-Capillary: 114 mg/dL — ABNORMAL HIGH (ref 70–99)
Glucose-Capillary: 112 mg/dL — ABNORMAL HIGH (ref 70–99)
Glucose-Capillary: 150 mg/dL — ABNORMAL HIGH (ref 70–99)
Glucose-Capillary: 122 mg/dL — ABNORMAL HIGH (ref 70–99)

## 2021-04-04 LAB — CBC WITH DIFFERENTIAL/PLATELET
Abs Immature Granulocytes: 0.05 10*3/uL (ref 0.00–0.07)
Basophils Absolute: 0.1 10*3/uL (ref 0.0–0.1)
Basophils Relative: 1 %
Eosinophils Absolute: 0.2 10*3/uL (ref 0.0–0.5)
Eosinophils Relative: 2 %
HCT: 28.9 % — ABNORMAL LOW (ref 36.0–46.0)
Hemoglobin: 9 g/dL — ABNORMAL LOW (ref 12.0–15.0)
Immature Granulocytes: 0 %
Lymphocytes Relative: 33 %
Lymphs Abs: 3.7 10*3/uL (ref 0.7–4.0)
MCH: 28.8 pg (ref 26.0–34.0)
MCHC: 31.1 g/dL (ref 30.0–36.0)
MCV: 92.6 fL (ref 80.0–100.0)
Monocytes Absolute: 0.6 10*3/uL (ref 0.1–1.0)
Monocytes Relative: 5 %
Neutro Abs: 6.8 10*3/uL (ref 1.7–7.7)
Neutrophils Relative %: 59 %
Platelets: 360 10*3/uL (ref 150–400)
RBC: 3.12 MIL/uL — ABNORMAL LOW (ref 3.87–5.11)
RDW: 15.9 % — ABNORMAL HIGH (ref 11.5–15.5)
WBC: 11.3 10*3/uL — ABNORMAL HIGH (ref 4.0–10.5)
nRBC: 0 % (ref 0.0–0.2)

## 2021-04-04 LAB — HEMOGLOBIN A1C
Hgb A1c MFr Bld: 8.3 % — ABNORMAL HIGH (ref 4.8–5.6)
Mean Plasma Glucose: 191.51 mg/dL

## 2021-04-04 LAB — COMPREHENSIVE METABOLIC PANEL
ALT: 13 U/L (ref 0–44)
AST: 13 U/L — ABNORMAL LOW (ref 15–41)
Albumin: 3.3 g/dL — ABNORMAL LOW (ref 3.5–5.0)
Alkaline Phosphatase: 45 U/L (ref 38–126)
Anion gap: 7 (ref 5–15)
BUN: 33 mg/dL — ABNORMAL HIGH (ref 8–23)
CO2: 21 mmol/L — ABNORMAL LOW (ref 22–32)
Calcium: 8.9 mg/dL (ref 8.9–10.3)
Chloride: 110 mmol/L (ref 98–111)
Creatinine, Ser: 2.23 mg/dL — ABNORMAL HIGH (ref 0.44–1.00)
GFR, Estimated: 23 mL/min — ABNORMAL LOW (ref >60)
Glucose, Bld: 143 mg/dL — ABNORMAL HIGH (ref 70–99)
Potassium: 3.9 mmol/L (ref 3.5–5.1)
Sodium: 138 mmol/L (ref 135–145)
Total Bilirubin: 0.2 mg/dL — ABNORMAL LOW (ref 0.3–1.2)
Total Protein: 7 g/dL (ref 6.5–8.1)

## 2021-04-04 LAB — TSH: TSH: 1.597 u[IU]/mL (ref 0.350–4.500)

## 2021-04-04 LAB — HIV ANTIBODY (ROUTINE TESTING W REFLEX): HIV Screen 4th Generation wRfx: NONREACTIVE

## 2021-04-04 LAB — FOLATE: Folate: 15.1 ng/mL (ref >5.9)

## 2021-04-04 LAB — VITAMIN B12: Vitamin B-12: 1308 pg/mL — ABNORMAL HIGH (ref 180–914)

## 2021-04-04 SURGERY — ESOPHAGOGASTRODUODENOSCOPY (EGD) WITH PROPOFOL
Anesthesia: Choice

## 2021-04-04 MED ORDER — SODIUM CHLORIDE 0.9 % IV SOLN: INTRAVENOUS | Status: DC

## 2021-04-04 MED ORDER — FLUTICASONE FUROATE-VILANTEROL 100-25 MCG/INH IN AEPB: 1.0000 | INHALATION_SPRAY | Freq: Every day | RESPIRATORY_TRACT | Status: DC | PRN

## 2021-04-04 MED ORDER — EPHEDRINE 5 MG/ML INJ
INTRAVENOUS | Status: AC
  Filled 2021-04-04: qty 5

## 2021-04-04 MED ORDER — SODIUM CHLORIDE 0.9 % IV SOLN: INTRAVENOUS | Status: AC

## 2021-04-04 MED ORDER — IPRATROPIUM-ALBUTEROL 0.5-2.5 (3) MG/3ML IN SOLN
3.0000 mL | Freq: Four times a day (QID) | RESPIRATORY_TRACT | Status: DC
  Administered 2021-04-04: 3 mL via RESPIRATORY_TRACT
  Filled 2021-04-04: qty 3

## 2021-04-04 MED ORDER — ONDANSETRON HCL 4 MG/2ML IJ SOLN
4.0000 mg | Freq: Four times a day (QID) | INTRAMUSCULAR | Status: DC | PRN
  Administered 2021-04-04: 4 mg via INTRAVENOUS
  Filled 2021-04-04: qty 2

## 2021-04-04 MED ORDER — ONDANSETRON HCL 4 MG PO TABS: 4.0000 mg | ORAL_TABLET | Freq: Four times a day (QID) | ORAL | Status: DC | PRN

## 2021-04-04 MED ORDER — PROPOFOL 10 MG/ML IV BOLUS
INTRAVENOUS | Status: DC | PRN
  Administered 2021-04-04: 30 mg via INTRAVENOUS
  Administered 2021-04-04: 40 mg via INTRAVENOUS
  Administered 2021-04-04: 30 mg via INTRAVENOUS
  Administered 2021-04-04 (×2): 20 mg via INTRAVENOUS
  Administered 2021-04-04: 80 mg via INTRAVENOUS
  Administered 2021-04-04: 30 mg via INTRAVENOUS

## 2021-04-04 MED ORDER — UMECLIDINIUM BROMIDE 62.5 MCG/INH IN AEPB: 1.0000 | INHALATION_SPRAY | Freq: Every day | RESPIRATORY_TRACT | Status: DC | PRN

## 2021-04-04 MED ORDER — ALBUTEROL SULFATE (2.5 MG/3ML) 0.083% IN NEBU: 2.5000 mg | INHALATION_SOLUTION | Freq: Four times a day (QID) | RESPIRATORY_TRACT | Status: DC | PRN

## 2021-04-04 MED ORDER — PHENYLEPHRINE 40 MCG/ML (10ML) SYRINGE FOR IV PUSH (FOR BLOOD PRESSURE SUPPORT)
PREFILLED_SYRINGE | INTRAVENOUS | Status: AC
  Filled 2021-04-04: qty 10

## 2021-04-04 MED ORDER — PHENYLEPHRINE HCL (PRESSORS) 10 MG/ML IV SOLN
INTRAVENOUS | Status: DC | PRN
  Administered 2021-04-04 (×2): 80 ug via INTRAVENOUS

## 2021-04-04 MED ORDER — ROSUVASTATIN CALCIUM 20 MG PO TABS
40.0000 mg | ORAL_TABLET | Freq: Every day | ORAL | Status: DC
  Administered 2021-04-04 – 2021-04-05 (×2): 40 mg via ORAL
  Filled 2021-04-04 (×2): qty 2

## 2021-04-04 MED ORDER — INSULIN ASPART 100 UNIT/ML IJ SOLN
0.0000 [IU] | Freq: Three times a day (TID) | INTRAMUSCULAR | Status: DC
  Administered 2021-04-04: 2 [IU] via SUBCUTANEOUS
  Administered 2021-04-04: 3 [IU] via SUBCUTANEOUS
  Administered 2021-04-04: 2 [IU] via SUBCUTANEOUS

## 2021-04-04 MED ORDER — CARVEDILOL 12.5 MG PO TABS
25.0000 mg | ORAL_TABLET | Freq: Two times a day (BID) | ORAL | Status: DC
  Administered 2021-04-04 – 2021-04-05 (×3): 25 mg via ORAL
  Filled 2021-04-04 (×3): qty 2

## 2021-04-04 MED ORDER — FAMOTIDINE 20 MG PO TABS
40.0000 mg | ORAL_TABLET | Freq: Every day | ORAL | Status: DC
  Administered 2021-04-04 – 2021-04-05 (×2): 40 mg via ORAL
  Filled 2021-04-04 (×2): qty 2

## 2021-04-04 MED ORDER — PANTOPRAZOLE SODIUM 40 MG PO TBEC
40.0000 mg | DELAYED_RELEASE_TABLET | Freq: Every day | ORAL | Status: DC
  Administered 2021-04-04: 40 mg via ORAL
  Filled 2021-04-04: qty 1

## 2021-04-04 MED ORDER — PROPOFOL 10 MG/ML IV BOLUS
INTRAVENOUS | Status: AC
  Filled 2021-04-04: qty 80

## 2021-04-04 MED ORDER — LIDOCAINE HCL (PF) 2 % IJ SOLN
INTRAMUSCULAR | Status: AC
  Filled 2021-04-04: qty 25

## 2021-04-04 MED ORDER — PANTOPRAZOLE SODIUM 40 MG PO TBEC
40.0000 mg | DELAYED_RELEASE_TABLET | Freq: Two times a day (BID) | ORAL | Status: DC
  Administered 2021-04-04 – 2021-04-05 (×2): 40 mg via ORAL
  Filled 2021-04-04 (×3): qty 1

## 2021-04-04 MED ORDER — AMLODIPINE BESYLATE 5 MG PO TABS
5.0000 mg | ORAL_TABLET | Freq: Every day | ORAL | Status: DC
  Administered 2021-04-04 – 2021-04-05 (×2): 5 mg via ORAL
  Filled 2021-04-04 (×2): qty 1

## 2021-04-04 MED ORDER — ASPIRIN EC 81 MG PO TBEC
162.0000 mg | DELAYED_RELEASE_TABLET | Freq: Every day | ORAL | Status: DC
  Administered 2021-04-04 – 2021-04-05 (×2): 162 mg via ORAL
  Filled 2021-04-04 (×2): qty 2

## 2021-04-04 MED ORDER — LACTATED RINGERS IV SOLN: INTRAVENOUS | Status: DC

## 2021-04-04 MED ORDER — ACETAMINOPHEN 325 MG PO TABS: 650.0000 mg | ORAL_TABLET | Freq: Four times a day (QID) | ORAL | Status: DC | PRN

## 2021-04-04 MED ORDER — SODIUM BICARBONATE 650 MG PO TABS
650.0000 mg | ORAL_TABLET | Freq: Three times a day (TID) | ORAL | Status: DC
  Administered 2021-04-04 – 2021-04-05 (×4): 650 mg via ORAL
  Filled 2021-04-04 (×4): qty 1

## 2021-04-04 MED ORDER — GEMFIBROZIL 600 MG PO TABS
600.0000 mg | ORAL_TABLET | Freq: Two times a day (BID) | ORAL | Status: DC
  Administered 2021-04-04 – 2021-04-05 (×3): 600 mg via ORAL
  Filled 2021-04-04 (×3): qty 1

## 2021-04-04 MED ORDER — LIDOCAINE HCL (CARDIAC) PF 100 MG/5ML IV SOSY
PREFILLED_SYRINGE | INTRAVENOUS | Status: DC | PRN
  Administered 2021-04-04: 60 mg via INTRAVENOUS

## 2021-04-04 MED ORDER — INSULIN ASPART 100 UNIT/ML IJ SOLN: 0.0000 [IU] | Freq: Every day | INTRAMUSCULAR | Status: DC

## 2021-04-04 MED ORDER — ACETAMINOPHEN 650 MG RE SUPP: 650.0000 mg | Freq: Four times a day (QID) | RECTAL | Status: DC | PRN

## 2021-04-04 MED ORDER — INSULIN DETEMIR 100 UNIT/ML ~~LOC~~ SOLN
30.0000 [IU] | Freq: Every day | SUBCUTANEOUS | Status: DC
  Administered 2021-04-04: 30 [IU] via SUBCUTANEOUS
  Filled 2021-04-04 (×3): qty 0.3

## 2021-04-04 MED ORDER — IPRATROPIUM-ALBUTEROL 0.5-2.5 (3) MG/3ML IN SOLN
3.0000 mL | Freq: Three times a day (TID) | RESPIRATORY_TRACT | Status: DC
  Administered 2021-04-04 – 2021-04-05 (×4): 3 mL via RESPIRATORY_TRACT
  Filled 2021-04-04 (×4): qty 3

## 2021-04-04 MED ORDER — HEPARIN SODIUM (PORCINE) 5000 UNIT/ML IJ SOLN: 5000.0000 [IU] | Freq: Three times a day (TID) | INTRAMUSCULAR | Status: DC

## 2021-04-04 MED ORDER — FLUTICASONE-UMECLIDIN-VILANT 100-62.5-25 MCG/INH IN AEPB: 1.0000 | INHALATION_SPRAY | Freq: Every day | RESPIRATORY_TRACT | Status: DC | PRN

## 2021-04-04 NOTE — Op Note (Addendum)
[facility] Patient Name: Katherine Stokes Procedure Date: 04/04/2021 1:17 PM MRN: [id number] Date of Birth: 1950/11/16 Attending MD: [name] ,  CSN: [id number] Age: 70 Admit Type: Outpatient Procedure:                Upper GI endoscopy Indications:              Iron deficiency anemia, Dysphagia Providers:                [name], Caprice Kluver, Casimer Bilis,                            Technician Referring MD:              Medicines:                Monitored Anesthesia Care Complications:            No immediate complications. Estimated Blood Loss:     Estimated blood loss: none. Procedure:                Pre-Anesthesia Assessment:                           - Prior to the procedure, a History and Physical                            was performed, and patient medications, allergies                            and sensitivities were reviewed. The patient's                            tolerance of previous anesthesia was reviewed.                           - The risks and benefits of the procedure and the                            sedation options and risks were discussed with the                            patient. All questions were answered and informed                            consent was obtained.                           - ASA Grade Assessment: III - A patient with severe                            systemic disease.                           After obtaining informed consent, the endoscope was                            passed under direct vision. Throughout the  procedure, the patient's blood pressure, pulse, and                            oxygen saturations were monitored continuously. The                            [id number]-H190 ([id number]) scope was introduced through the                            mouth, and advanced to the second part of duodenum.                            The upper GI endoscopy was accomplished without                             difficulty. The patient tolerated the procedure                            well. Scope In: 1:23:59 PM Scope Out: 1:41:47 PM Total Procedure Duration: 0 hours 17 minutes 48 seconds  Findings:      A 2 cm hiatal hernia was present.      One benign-appearing, intrinsic moderate (circumferential scarring or       stenosis; an endoscope may pass) stenosis was found at the       gastroesophageal junction. This stenosis measured 1 cm (inner diameter)       x less than one cm (in length). The stenosis was traversed. A TTS       dilator was passed through the scope. Dilation with a 12-13.5-15 mm       balloon dilator was performed to 15 mm. There was mucosal disruption       upon reinspection.      Two 4 to 12 mm pedunculated and sessile polyps with no bleeding and       stigmata of recent bleeding were found at the pylorus. The polyp was       removed with a hot snare. Resection and retrieval were complete with a       Roth net. To close a defect after polypectomy, one hemostatic clip was       successfully placed. There was no bleeding at the end of the procedure.      The examined duodenum was normal. Impression:               - 2 cm hiatal hernia.                           - Benign-appearing esophageal stenosis. Dilated.                           - Two gastric polyps. Resected and retrieved. Clip                            was placed.                           - Normal examined duodenum. Moderate Sedation:      Per Anesthesia Care Recommendation:           -  Return patient to hospital ward for ongoing care.                           - Soft diet.                           - Use Protonix (pantoprazole) 40 mg PO BID for 4                            weeks.                           -Follow up pathology                           - Start ferrous sulfate 375 mg once a day. Procedure Code(s):        --- Professional ---                           [phone],  Esophagogastroduodenoscopy, flexible,                            transoral; with removal of tumor(s), polyp(s), or                            other lesion(s) by snare technique                           [id number], Esophagogastroduodenoscopy, flexible,                            transoral; with transendoscopic balloon dilation of                            esophagus (less than 30 mm diameter) Diagnosis Code(s):        --- Professional ---                           K44.9, Diaphragmatic hernia without obstruction or                            gangrene                           K22.2, Esophageal obstruction                           K31.7, Polyp of stomach and duodenum                           D50.9, Iron deficiency anemia, unspecified                           R13.10, Dysphagia, unspecified CPT copyright 2019 [employer]. All rights reserved. The codes documented in this report are preliminary and upon coder review may  be revised to meet current compliance requirements. [name], MD [name],  04/04/2021 1:49:56 PM  This report has been signed electronically. Number of Addenda: 0

## 2021-04-04 NOTE — Transfer of Care (Signed)
Immediate Anesthesia Transfer of Care Note  Patient: Katherine Stokes  Procedure(s) Performed: ESOPHAGOGASTRODUODENOSCOPY (EGD) WITH PROPOFOL ESOPHAGEAL DILATION POLYPECTOMY  Patient Location: PACU  Anesthesia Type:General  Level of Consciousness: drowsy  Airway & Oxygen Therapy: Patient Spontanous Breathing and Patient connected to nasal cannula oxygen  Post-op Assessment: Report given to RN and Post -op Vital signs reviewed and stable  Post vital signs: Reviewed and stable  Last Vitals:  Vitals Value Taken Time  BP    Temp    Pulse    Resp    SpO2      Last Pain:  Vitals:   04/04/21 1321  TempSrc:   PainSc: 0-No pain      Patients Stated Pain Goal: 5 (03/75/43 6067)  Complications: No notable events documented.

## 2021-04-04 NOTE — H&P (Signed)
TRH H&P    Patient Demographics:    Katherine Stokes, is a 70 y.o. female  MRN: [id number]  DOB - 1951-04-29  Admit Date - 04/03/2021  Referring MD/NP/PA: Tivis Ringer  Outpatient Primary MD for the patient is Neale Burly, MD  Patient coming from: Home  Chief complaint-dyspnea and right upper quadrant pain   HPI:    Katherine Stokes  is a 70 y.o. female, with history of CKD, essential hypertension, GERD, mixed hyperlipidemia, diabetes mellitus type 2, presents the ED with a chief complaint of dyspnea and right upper quadrant pain.  Patient reports this is been going on 3 months, and is no worse today than it was any other day.  She has been working this up in the outpatient setting.  She had a stress test 1 month ago.  She was prescribed PPI and Carafate.  She has been scheduled to see GI in 2023.  She is also scheduled to see a pulmonologist.  She had a CT without contrast of her abdomen that showed gallstones.  She was referred to Dr. Arnoldo Morale who she saw the day of presentation.  He did an exam and said that he did not think it was a gallbladder, but wanted her to present to the ED, family is not sure why.  Upon reading Dr. Cam Hai note there was not a recommendation to come straight to the ED.  Reported that she had no symptoms consistent with biliary colic.  In the exam did not indicate a cholecystectomy either.  Patient reports that this set of symptoms has been progressively worse over 3 months.  Her dyspnea is worse with exertion.  She has no orthopnea.  No palpitations.  She reports that her chest wall hurts while eating, with position changes, and with breathing.  She has had a decreased appetite.  Patient also reports dysphagia.  Its not consistent dysphagia, but sometimes she feels like food gets stuck when she cannot get it up or down.  She does report she is drinking about 40 ounces of water per day.  She had  no change in her urine habits, color, or output.  No diarrhea.  She reports dark bowel movements, is not on iron or Pepto.  The bowel movements are not tarry.  She reports that the Protonix and Carafate that she was prescribed helps sometimes, but not all the time.  She describes the pain in her right upper quadrant as a dull ache, with occasional sharp twinges.  She was prescribed course of steroids-presumably for costochondritis-1 week ago that did not help.  Patient has no complaint of nausea or vomiting.  Patient has no further complaints at this time.  Patient does not smoke, does not drink alcohol, does not use illicit drugs.  She is vaccinated for COVID.  Patient is full code.  In the ED Temp 97.6, heart rate 66-73, blood pressure 135/69, respiratory rate 14-24, blood pressure 94/46 VQ shows no PE EKG shows a heart rate of 72, normal sinus rhythm, QTC 431 Troponins flat at 5 and 5 CT  abdomen pelvis showing no urinary tract calculi.  Cholelithiasis.  Small hiatal hernia.  Chest x-ray shows no active cardiopulmonary disease Slight leukocytosis 11.7, hemoglobin stable 9.9 Chemistry panel reveals a AKI with a creatinine of 2.44 Patient was given GI cocktail, Norco, Dilaudid, DuoNeb x2, normal saline 1 L BNP was 26     Review of systems:    In addition to the HPI above,  No Fever-chills, No Headache, No changes with Vision or hearing, Admits to dysphagia, Admits to right sided chest wall pain/right upper quadrant pain , No Nausea or Vomiting, bowel movements are regular, No Blood in stool or Urine, No dysuria, No new skin rashes or bruises, No new joints pains-aches,  No new weakness, tingling, numbness in any extremity, No recent weight gain or loss, No polyuria, polydypsia or polyphagia, No significant Mental Stressors.  All other systems reviewed and are negative.    Past History of the following :    Past Medical History:  Diagnosis Date   Asthma    Chronic kidney  disease (CKD), stage II (mild)    Essential hypertension    GERD (gastroesophageal reflux disease)    History of stroke    Residual right leg weakness   Mixed hyperlipidemia    Type 2 diabetes mellitus (HCC)       Past Surgical History:  Procedure Laterality Date   ABDOMINAL HYSTERECTOMY     TONSILLECTOMY        Social History:      Social History   Tobacco Use   Smoking status: Never   Smokeless tobacco: Never  Substance Use Topics   Alcohol use: Never       Family History :     Family History  Problem Relation Age of Onset   Dementia Mother    Colon cancer Father       Home Medications:   Prior to Admission medications   Medication Sig Start Date End Date Taking? Authorizing Provider  albuterol (PROVENTIL) (2.5 MG/3ML) 0.083% nebulizer solution Take 2.5 mg by nebulization every 6 (six) hours as needed for wheezing or shortness of breath.   Yes [provider]  allopurinol (ZYLOPRIM) 300 MG tablet Take 300 mg by mouth daily.   Yes [provider]  amLODipine (NORVASC) 5 MG tablet Take 5 mg by mouth daily.   Yes [provider]  aspirin EC 81 MG tablet Take 162 mg by mouth daily. Swallow whole.   Yes [provider]  benazepril-hydrochlorthiazide (LOTENSIN HCT) 20-25 MG tablet Take 1 tablet by mouth daily.   Yes [provider]  carvedilol (COREG) 25 MG tablet Take 25 mg by mouth 2 (two) times daily with a meal.   Yes [provider]  cholecalciferol (VITAMIN D) 25 MCG (1000 UNIT) tablet Take 1,000 Units by mouth daily.   Yes [provider]  diclofenac Sodium (VOLTAREN) 1 % GEL Apply 2 g topically 4 (four) times daily.   Yes [provider]  famotidine (PEPCID) 40 MG tablet Take 40 mg by mouth daily.   Yes [provider]  Fluticasone-Umeclidin-Vilant (TRELEGY ELLIPTA) 100-62.5-25 MCG/INH AEPB Inhale 1 puff into the lungs daily as needed (shortness of breath).   Yes [provider]  gemfibrozil (LOPID) 600 MG tablet Take 600 mg by mouth 2 (two) times daily before a meal.   Yes [provider]  insulin aspart (NOVOLOG FLEXPEN) 100 UNIT/ML FlexPen Inject 25 Units into the skin 3 (three) times daily with meals.  Yes [provider]  insulin glargine (LANTUS SOLOSTAR) 100 UNIT/ML Solostar Pen Inject 40 Units into the skin at bedtime.   Yes [provider]  latanoprost (XALATAN) 0.005 % ophthalmic solution Place 1 drop into both eyes at bedtime. 02/08/21  Yes [provider]  linaCLOtide (LINZESS PO) Take 1 tablet by mouth daily as needed (constipation).   Yes [provider]  Omega-3 Fatty Acids (FISH OIL) 1000 MG CAPS Take 1 capsule by mouth 3 (three) times daily.   Yes [provider]  pantoprazole (PROTONIX) 40 MG tablet Take 40 mg by mouth daily.   Yes [provider]  rosuvastatin (CRESTOR) 40 MG tablet Take 40 mg by mouth daily. 03/27/21  Yes [provider]  sodium bicarbonate 650 MG tablet Take 650 mg by mouth 3 (three) times daily.   Yes [provider]  vitamin B-12 (CYANOCOBALAMIN) 500 MCG tablet Take 500 mcg by mouth daily.   Yes [provider]  albuterol (VENTOLIN HFA) 108 (90 Base) MCG/ACT inhaler Inhale 2 puffs into the lungs every 6 (six) hours as needed for wheezing or shortness of breath. Patient not taking: Reported on 04/03/2021    [provider]  methylPREDNISolone (MEDROL DOSEPAK) 4 MG TBPK tablet See admin instructions. see package Patient not taking: Reported on 04/03/2021 03/20/21   [provider]  sucralfate (CARAFATE) 1 GM/10ML suspension Take 1 g by mouth 4 (four) times daily -  with meals and at bedtime. Patient not taking: Reported on 04/03/2021    [provider]     Allergies:     Allergies  Allergen Reactions   Penicillins     REACTION: rash/swelling   Sulfa Antibiotics Rash     Physical Exam:    Vitals  Blood pressure (!) 134/56, pulse 68, temperature 98 F (36.7 C), temperature source Oral, resp. rate 19, SpO2 100 %.  1.  General: Patient lying supine in bed,  no acute distress   2. Psychiatric: Alert and oriented x 3, mood and behavior normal for situation, pleasant and cooperative with exam   3. Neurologic: Speech and language are normal, face is symmetric, moves all 4 extremities voluntarily, at baseline without acute deficits on limited exam   4. HEENMT:  Head is atraumatic, normocephalic, pupils reactive to light, neck is supple, trachea is midline, mucous membranes are moist   5. Respiratory : Lungs are clear to auscultation bilaterally without wheezing, rhonchi, rales, no cyanosis, no increase in work of breathing or accessory muscle use   6. Cardiovascular : Heart rate normal, rhythm is regular, no murmurs, rubs or gallops, no peripheral edema, peripheral pulses palpated   7. Gastrointestinal:  Abdomen is soft, nondistended, nontender to palpation bowel sounds active, no masses or organomegaly palpated, no Murphy sign, no tenderness over McBurney's point, no tenderness over palpation of right ribs   8. Skin:  Skin is warm, dry and intact without rashes, acute lesions, or ulcers on limited exam   9.Musculoskeletal:  No acute deformities or trauma, no asymmetry in tone, no peripheral edema, peripheral pulses palpated, no tenderness to palpation in the extremities     Data Review:    CBC Recent Labs  Lab 04/03/21 1420  WBC 11.7*  HGB 9.9*  HCT 31.7*  PLT 403*  MCV 93.0  MCH 29.0  MCHC 31.2  RDW 15.9*   ------------------------------------------------------------------------------------------------------------------  Results for orders placed or performed during the hospital encounter of 04/03/21 (from the past 48 hour(s))  Lipase, blood  Status: None   Collection Time: 04/03/21  2:20 PM  Result Value Ref Range   Lipase 23 11 - 51 U/L     Comment: Performed at [facility], [address], [place], [place] [address]  Comprehensive metabolic panel     Status: Abnormal   Collection Time: 04/03/21  2:20 PM  Result Value Ref Range   Sodium 139 135 - 145 mmol/L   Potassium 4.3 3.5 - 5.1 mmol/L   Chloride 110 98 - 111 mmol/L   CO2 20 (L) 22 - 32 mmol/L   Glucose, Bld 139 (H) 70 - 99 mg/dL    Comment: Glucose reference range applies only to samples taken after fasting for at least 8 hours.   BUN 36 (H) 8 - 23 mg/dL   Creatinine, Ser 2.44 (H) 0.44 - 1.00 mg/dL   Calcium 9.5 8.9 - 10.3 mg/dL   Total Protein 8.1 6.5 - 8.1 g/dL   Albumin 3.8 3.5 - 5.0 g/dL   AST 15 15 - 41 U/L   ALT 16 0 - 44 U/L   Alkaline Phosphatase 57 38 - 126 U/L   Total Bilirubin 0.5 0.3 - 1.2 mg/dL   GFR, Estimated 21 (L) >60 mL/min    Comment: (NOTE) Calculated using the CKD-EPI Creatinine Equation (2021)    Anion gap 9 5 - 15    Comment: Performed at [facility], [address] [place], [place] [address]  CBC     Status: Abnormal   Collection Time: 04/03/21  2:20 PM  Result Value Ref Range   WBC 11.7 (H) 4.0 - 10.5 K/uL   RBC 3.41 (L) 3.87 - 5.11 MIL/uL   Hemoglobin 9.9 (L) 12.0 - 15.0 g/dL   HCT 31.7 (L) 36.0 - 46.0 %   MCV 93.0 80.0 - 100.0 fL   MCH 29.0 26.0 - 34.0 pg   MCHC 31.2 30.0 - 36.0 g/dL   RDW 15.9 (H) 11.5 - 15.5 %   Platelets 403 (H) 150 - 400 K/uL   nRBC 0.0 0.0 - 0.2 %    Comment: Performed at [facility], [address] [place], [place] [address]  Troponin I (High Sensitivity)     Status: None   Collection Time: 04/03/21  2:20 PM  Result Value Ref Range   Troponin I (High Sensitivity) 5 <18 ng/L    Comment: (NOTE) Elevated high sensitivity troponin I (hsTnI) values and significant  changes across serial measurements may suggest ACS but many other  chronic and acute conditions are known to elevate hsTnI results.  Refer to the "Links" section for chest pain algorithms and additional  guidance. Performed at [facility], [address], [place], [place] [address]   Brain natriuretic peptide     Status: None   Collection Time: 04/03/21  2:20 PM  Result Value Ref Range   B Natriuretic Peptide 26.0 0.0 - 100.0 pg/mL    Comment: Performed at [facility], [address] [place], [place] [address]  Troponin I (High Sensitivity)     Status: None   Collection Time: 04/03/21  4:34 PM  Result Value Ref Range   Troponin I (High Sensitivity) 5 <18 ng/L    Comment: (NOTE) Elevated high sensitivity troponin I (hsTnI) values and significant  changes across serial measurements may suggest ACS but many other  chronic and acute conditions are known to elevate hsTnI results.  Refer to the "Links" section for chest pain algorithms and additional  guidance. Performed at [facility],  [address]  [place], [place] [address]   Resp Panel by RT-PCR (Flu A&B, Covid) Nasopharyngeal Swab     Status: None   Collection Time: 04/03/21  8:40 PM   Specimen: Nasopharyngeal Swab; Nasopharyngeal(NP) swabs in vial transport medium  Result Value Ref Range   SARS Coronavirus 2 by RT PCR NEGATIVE NEGATIVE    Comment: (NOTE) SARS-CoV-2 target nucleic acids are NOT DETECTED.  The SARS-CoV-2 RNA is generally detectable in upper respiratory specimens during the acute phase of infection. The lowest concentration of SARS-CoV-2 viral copies this assay can detect is 138 copies/mL. A negative result does not preclude SARS-Cov-2 infection and should not be used as the sole basis for treatment or other patient management decisions. A negative result may occur with  improper specimen collection/handling, submission of specimen other than nasopharyngeal swab, presence of viral mutation(s) within the areas targeted by this assay, and inadequate number of viral copies(<138 copies/mL). A negative result must be combined with clinical observations, patient history, and epidemiological information. The expected result is Negative.  Fact Sheet  for Patients:  EntrepreneurPulse.com.au  Fact Sheet for Healthcare Providers:  IncredibleEmployment.be  This test is no t yet approved or cleared by the [place] FDA and  has been authorized for detection and/or diagnosis of SARS-CoV-2 by FDA under an Emergency Use Authorization (EUA). This EUA will remain  in effect (meaning this test can be used) for the duration of the COVID-19 declaration under Section 564(b)(1) of the Act, 21 U.S.C.section 360bbb-3(b)(1), unless the authorization is terminated  or revoked sooner.       Influenza A by PCR NEGATIVE NEGATIVE   Influenza B by PCR NEGATIVE NEGATIVE    Comment: (NOTE) The Xpert Xpress SARS-CoV-2/FLU/RSV plus assay is intended as an aid in the diagnosis of influenza from Nasopharyngeal swab specimens and should not be used as a sole basis for treatment. Nasal washings and aspirates are unacceptable for Xpert Xpress SARS-CoV-2/FLU/RSV testing.  Fact Sheet for Patients: EntrepreneurPulse.com.au  Fact Sheet for Healthcare Providers: IncredibleEmployment.be  This test is not yet approved or cleared by the [place] FDA and has been authorized for detection and/or diagnosis of SARS-CoV-2 by FDA under an Emergency Use Authorization (EUA). This EUA will remain in effect (meaning this test can be used) for the duration of the COVID-19 declaration under Section 564(b)(1) of the Act, 21 U.S.C. section 360bbb-3(b)(1), unless the authorization is terminated or revoked.  Performed at [facility], [address], [place], [place] [address]   Urinalysis, Routine w reflex microscopic Urine, Clean Catch     Status: Abnormal   Collection Time: 04/03/21 10:50 PM  Result Value Ref Range   Color, Urine YELLOW YELLOW   APPearance HAZY (A) CLEAR   Specific Gravity, Urine 1.015 1.005 - 1.030   pH 5.0 5.0 - 8.0   Glucose, UA >=500 (A) NEGATIVE mg/dL   Hgb urine  dipstick NEGATIVE NEGATIVE   Bilirubin Urine NEGATIVE NEGATIVE   Ketones, ur NEGATIVE NEGATIVE mg/dL   Protein, ur NEGATIVE NEGATIVE mg/dL   Nitrite NEGATIVE NEGATIVE   Leukocytes,Ua NEGATIVE NEGATIVE   RBC / HPF 0-5 0 - 5 RBC/hpf   WBC, UA 6-10 0 - 5 WBC/hpf   Bacteria, UA NONE SEEN NONE SEEN   Squamous Epithelial / LPF 0-5 0 - 5   Mucus PRESENT     Comment: Performed at [facility], [address], [place], [place] [address]    Chemistries  Recent Labs  Lab 04/03/21 1420  NA 139  K 4.3  CL 110  CO2 20*  GLUCOSE 139*  BUN 36*  CREATININE 2.44*  CALCIUM 9.5  AST 15  ALT 16  ALKPHOS 57  BILITOT 0.5   ------------------------------------------------------------------------------------------------------------------  ------------------------------------------------------------------------------------------------------------------ GFR: Estimated Creatinine Clearance: 23.6 mL/min (A) (by C-G formula based on SCr of 2.44 mg/dL (H)). Liver Function Tests: Recent Labs  Lab 04/03/21 1420  AST 15  ALT 16  ALKPHOS 57  BILITOT 0.5  PROT 8.1  ALBUMIN 3.8   Recent Labs  Lab 04/03/21 1420  LIPASE 23   No results for input(s): AMMONIA in the last 168 hours. Coagulation Profile: No results for input(s): INR, PROTIME in the last 168 hours. Cardiac Enzymes: No results for input(s): CKTOTAL, CKMB, CKMBINDEX, TROPONINI in the last 168 hours. BNP (last 3 results) No results for input(s): PROBNP in the last 8760 hours. HbA1C: No results for input(s): HGBA1C in the last 72 hours. CBG: No results for input(s): GLUCAP in the last 168 hours. Lipid Profile: No results for input(s): CHOL, HDL, LDLCALC, TRIG, CHOLHDL, LDLDIRECT in the last 72 hours. Thyroid Function Tests: No results for input(s): TSH, T4TOTAL, FREET4, T3FREE, THYROIDAB in the last 72 hours. Anemia Panel: No results for input(s): VITAMINB12, FOLATE, FERRITIN, TIBC, IRON, RETICCTPCT in the last 72  hours.  --------------------------------------------------------------------------------------------------------------- Urine analysis:    Component Value Date/Time   COLORURINE YELLOW 04/03/2021 2250   APPEARANCEUR HAZY (A) 04/03/2021 2250   LABSPEC 1.015 04/03/2021 2250   PHURINE 5.0 04/03/2021 2250   GLUCOSEU >=500 (A) 04/03/2021 2250   HGBUR NEGATIVE 04/03/2021 2250   HGBUR negative 10/17/2007 1311   BILIRUBINUR NEGATIVE 04/03/2021 2250   KETONESUR NEGATIVE 04/03/2021 2250   PROTEINUR NEGATIVE 04/03/2021 2250   UROBILINOGEN negative 10/17/2007 1311   NITRITE NEGATIVE 04/03/2021 2250   LEUKOCYTESUR NEGATIVE 04/03/2021 2250      Imaging Results:    CT ABDOMEN PELVIS WO CONTRAST  Result Date: 04/03/2021 CLINICAL DATA:  Flank pain, kidney stone suspected EXAM: CT ABDOMEN AND PELVIS WITHOUT CONTRAST TECHNIQUE: Multidetector CT imaging of the abdomen and pelvis was performed following the standard protocol without IV contrast. COMPARISON:  None. FINDINGS: Lower chest: Lung bases are clear.  Small hiatal hernia. Hepatobiliary: No focal liver abnormality. Gallstones are present. No biliary dilatation. Pancreas: Unremarkable. Spleen: Unremarkable. Adrenals/Urinary Tract: Adrenals are unremarkable. There is no hydronephrosis. No renal calculi. Bladder is unremarkable. Stomach/Bowel: Stomach is within normal limits apart from hiatal hernia. Bowel is normal in caliber. Vascular/Lymphatic: Aortic atherosclerosis. No enlarged lymph nodes. Reproductive: Status post hysterectomy. No adnexal masses. Other: No free fluid.  Abdominal wall is unremarkable. Musculoskeletal: Degenerative changes of the included spine. IMPRESSION: No urinary tract calculi. Cholelithiasis. Small hiatal hernia. Aortic atherosclerosis. Electronically Signed   By: Macy Mis M.D.   On: 04/03/2021 16:31   DG Chest 2 View  Result Date: 04/03/2021 CLINICAL DATA:  Short of breath.  Right rib pain. EXAM: CHEST - 2 VIEW  COMPARISON:  Chest CT 03/26/2021 FINDINGS: The heart size and mediastinal contours are within normal limits. Both lungs are clear. The visualized skeletal structures are unremarkable. IMPRESSION: No active cardiopulmonary disease. Electronically Signed   By: Franchot Gallo M.D.   On: 04/03/2021 15:48   NM Pulmonary Perfusion  Result Date: 04/03/2021 CLINICAL DATA:  PE suspected, low/intermediate prob, positive D-dimer. Chest pain and dyspnea for while. EXAM: NUCLEAR MEDICINE PERFUSION LUNG SCAN TECHNIQUE: Perfusion images were obtained in multiple projections after intravenous injection of radiopharmaceutical. Ventilation scans intentionally deferred if perfusion scan and chest x-ray  adequate for interpretation during COVID 19 epidemic. RADIOPHARMACEUTICALS:  4.3 mCi Tc-69m MAA IV COMPARISON:  Chest radiograph from earlier today. FINDINGS: No significant perfusion defects in either lung. IMPRESSION: No scintigraphic evidence of acute pulmonary embolism. Electronically Signed   By: Ilona Sorrel M.D.   On: 04/03/2021 19:16      Assessment & Plan:    Active Problems:   AKI (acute kidney injury) ([place])   AKI 1 L fluids in the ED Continue gentle hydration Trend creatinine in the a.m. Continue home dose of sodium bicarb, bicarb is 20 on chemistry AKI is most likely secondary to poor p.o. intake given dysphagia Dysphagia Consult speech therapy Consult GI for possible EGD given dysphagia, abdominal pain, and new anemia with FOBT pending N.p.o. for now Acute on chronic anemia Hemoglobin in September was 11.6, today 9.9 Patient reports black stools, they are not however tarry FOBT pending Hold pharmaceutical DVT prophylaxis Trend hemoglobin in the a.m. GERD Continue Pepcid Hold Protonix in the setting of AKI Hypertension Continue Norvasc and Coreg Hold hydrochlorothiazide in the setting of AKI Hyperlipidemia Continue statin Diabetes mellitus type 2 Continue reduced dose of long-acting  insulin given n.p.o. status Continue sliding scale coverage    DVT Prophylaxis-   holding heparin in the setting of acute anemia  AM Labs Ordered, also please review Full Orders  Family Communication: Admission, patients condition and plan of care including tests being ordered have been discussed with the patient and daughter who indicate understanding and agree with the plan and Code Status.  Code Status: Full  Admission status: ObservationTime spent in minutes : [place]

## 2021-04-04 NOTE — Brief Op Note (Signed)
04/03/2021 - 04/04/2021  1:51 PM  PATIENT:  Katherine Stokes  70 y.o. female  PRE-OPERATIVE DIAGNOSIS:  dysphagia, melena, anemia  POST-OPERATIVE DIAGNOSIS:  hiatal hernia, gastric polyp (HS), stricture at GE junction  PROCEDURE:  Procedure(s): ESOPHAGOGASTRODUODENOSCOPY (EGD) WITH PROPOFOL (N/A) ESOPHAGEAL DILATION (N/A) POLYPECTOMY  SURGEON:  Surgeon(s) and Role:    * Harvel Quale, MD - Primary  Patient underwent EGD under propofol sedation.  Tolerated procedure adequately.  Patient was found to have a stricture at the GE junction which was dilated with a balloon up to 15 mm.  There was mucosal disruption.  Stomach had presence of 2 polyps at the pylorus with largest measuring 12 mm and with presence of inflammatory changes and stigmata of recent bleeding.  This polyp was removed with hot snare and was clipped x1.  Duodenum was within normal limits.  RECOMMENDATIONS: - Return patient to hospital ward for ongoing care.  - Soft diet.  - Use Protonix (pantoprazole) 40 mg PO BID for 4 weeks.  - Follow up pathology - Start ferrous sulfate 375 mg once a day.  [name], MD Gastroenterology and Hepatology [facility]

## 2021-04-04 NOTE — Progress Notes (Signed)
PROGRESS NOTE    Patient: Katherine Stokes                            PCP: Neale Burly, MD                    DOB: [birth year]            DOA: 04/03/2021 GUY:[id number]             DOS: 04/04/2021, 11:57 AM   LOS: 0 days   Date of Service: The patient was seen and examined on 04/04/2021  Subjective:   The patient was seen and examined this morning. Stable at this time.  Hemodynamically stable, satting 98% on room air Still complaining of : Flank/abdominal pain   Brief Narrative:    Katherine Stokes  is a 70 y.o. female, with history of CKD, essential hypertension, GERD, mixed hyperlipidemia, diabetes mellitus type 2, presents the ED with a chief complaint of dyspnea and right upper quadrant pain.   She has been working this up in the outpatient setting.  She had a stress test 1 month ago.  She was prescribed PPI and Carafate.  She has been scheduled to see GI in 2023.  She is also scheduled to see a pulmonologist.  She had a CT without contrast of her abdomen that showed gallstones.  She was referred to Dr. Arnoldo Morale who she saw the day of presentation... Reported symptoms not consistent with biliary colic not indicated cholecystectomy at this time.  ED Temp 97.6, HR 66-73, BP135/69, RR 14-24, blood pressure 94/46 VQ shows no PE EKG shows a heart rate of 72, normal sinus rhythm, QTC 431 Troponins flat at 5 and 5 CT abdomen pelvis showing no urinary tract calculi.  Cholelithiasis.  Small hiatal hernia.  Chest x-ray shows no active cardiopulmonary disease Slight leukocytosis 11.7, hemoglobin stable 9.9 Chemistry panel reveals a AKI with a creatinine of 2.44   Assessment & Plan:   Active Problems:   AKI (acute kidney injury) ([place])  Dyspepsia/dysphagia/abdominal pain -GI consulted planning for EGD today 04/04/2021 -N.p.o. -Speech also consulted for evaluation -As needed analgesics  Acute on chronic anemia -Baseline hemoglobin 11.6 on admission 9.9, -GI following plan for  EGD today -H&H, anemia work-up, iron studies, -Total iron 30, TIBC 295, percent sat 10, B12 1308, all within normal limits -FOBT-pending  Acute renal insufficiency -Baseline creatinine 1.48 -Status post 1 L normal saline, continue gentle IV fluid hydration, -Monitoring BUN/creatinine closely -I's and O -Currently on sodium bicarb, bicarb on admission 20 -Avoiding nephrotoxins  Cholelithiasis without cholecystitis  -Monitoring closely -CT abdomen pelvis reviewed, general surgery Dr. Arnoldo Morale note reviewed does not recommend cholecystectomy at this time    GERD -Continue Pepcid  Hypertension -Stable, resuming home medication of Norvasc and Coreg With holding HCTZ due to AKI  Hyperlipidemia -Continue statin  Diabetes mellitus type 2 -NPO, -Once tolerating p.o., will check CBG q. ACH S, with SSI coverage -Hemoglobin A1c -pending     --------------------------------------------------------------------------------------------------------------------------------- Cultures; None   Antimicrobials: None    Consultants: GI   ---------------------------------------------------------------------------------------------------------------------------------  DVT prophylaxis:  SCD/Compression stockings Code Status:   Code Status: Full Code  Family Communication: Family member present at bedside, updated The above findings and plan of care has been discussed with patient (and family)  in detail,  they expressed understanding and agreement of above. -Advance care planning has been discussed.   Admission status:   Status is:  Observation  The patient remains OBS appropriate and will d/c before 2 midnights.     Level of care: Med-Surg   Procedures:   No admission procedures for hospital encounter.    Antimicrobials:  Anti-infectives (From admission, onward)    None        Medication:   amLODipine  5 mg Oral Daily   aspirin EC  162 mg Oral Daily    carvedilol  25 mg Oral BID WC   famotidine  40 mg Oral Daily   gemfibrozil  600 mg Oral BID AC    HYDROmorphone (DILAUDID) injection  0.5 mg Intravenous Once   insulin aspart  0-15 Units Subcutaneous TID WC   insulin aspart  0-5 Units Subcutaneous QHS   insulin detemir  30 Units Subcutaneous QHS   ipratropium-albuterol  3 mL Nebulization TID   pantoprazole  40 mg Oral Daily   rosuvastatin  40 mg Oral Daily   sodium bicarbonate  650 mg Oral TID    acetaminophen **OR** acetaminophen, albuterol, umeclidinium bromide **AND** fluticasone furoate-vilanterol, ondansetron **OR** ondansetron (ZOFRAN) IV, oxyCODONE   Objective:   Vitals:   04/04/21 0555 04/04/21 0604 04/04/21 0726 04/04/21 0859  BP: (!) 129/55   (!) 144/59  Pulse: 71   79  Resp: 18   16  Temp: 98 F (36.7 C)   98.3 F (36.8 C)  TempSrc:    Oral  SpO2: 100%  100% 98%  Weight:  100.7 kg    Height:  5\' 2"  (1.575 m)      Intake/Output Summary (Last 24 hours) at 04/04/2021 1157 Last data filed at 04/04/2021 0900 Gross per 24 hour  Intake 0 ml  Output --  Net 0 ml   Filed Weights   04/04/21 0604  Weight: 100.7 kg     Examination:   Physical Exam  Constitution:  Alert, cooperative, no distress,  Appears calm and comfortable  Psychiatric: Normal and stable mood and affect, cognition intact,   HEENT: Normocephalic, PERRL, otherwise with in Normal limits  Chest:Chest symmetric Cardio vascular:  S1/S2, RRR, No murmure, No Rubs or Gallops  pulmonary: Clear to auscultation bilaterally, respirations unlabored, negative wheezes / crackles Abdomen: Soft, non-tender, non-distended, bowel sounds,no masses, no organomegaly Muscular skeletal: Limited exam - in bed, able to move all 4 extremities, Normal strength,  Neuro: CNII-XII intact. , normal motor and sensation, reflexes intact  Extremities: No pitting edema lower extremities, +2 pulses  Skin: Dry, warm to touch, negative for any Rashes, No open wounds Wounds: per  nursing documentation    ------------------------------------------------------------------------------------------------------------------------------------------    LABs:  CBC Latest Ref Rng & Units 04/04/2021 04/03/2021 03/07/2021  WBC 4.0 - 10.5 K/uL 11.3(H) 11.7(H) 9.6  Hemoglobin 12.0 - 15.0 g/dL 9.0(L) 9.9(L) 11.6(L)  Hematocrit 36.0 - 46.0 % 28.9(L) 31.7(L) 37.1  Platelets 150 - 400 K/uL 360 403(H) 368   CMP Latest Ref Rng & Units 04/04/2021 04/03/2021 03/07/2021  Glucose 70 - 99 mg/dL 143(H) 139(H) 257(H)  BUN 8 - 23 mg/dL 33(H) 36(H) 28(H)  Creatinine 0.44 - 1.00 mg/dL 2.23(H) 2.44(H) 1.48(H)  Sodium 135 - 145 mmol/L 138 139 139  Potassium 3.5 - 5.1 mmol/L 3.9 4.3 3.8  Chloride 98 - 111 mmol/L 110 110 109  CO2 22 - 32 mmol/L 21(L) 20(L) 23  Calcium 8.9 - 10.3 mg/dL 8.9 9.5 9.3  Total Protein 6.5 - 8.1 g/dL 7.0 8.1 -  Total Bilirubin 0.3 - 1.2 mg/dL 0.2(L) 0.5 -  Alkaline Phos 38 - 126 U/L  45 57 -  AST 15 - 41 U/L 13(L) 15 -  ALT 0 - 44 U/L 13 16 -       Micro Results Recent Results (from the past 240 hour(s))  Resp Panel by RT-PCR (Flu A&B, Covid) Nasopharyngeal Swab     Status: None   Collection Time: 04/03/21  8:40 PM   Specimen: Nasopharyngeal Swab; Nasopharyngeal(NP) swabs in vial transport medium  Result Value Ref Range Status   SARS Coronavirus 2 by RT PCR NEGATIVE NEGATIVE Final    Comment: (NOTE) SARS-CoV-2 target nucleic acids are NOT DETECTED.  The SARS-CoV-2 RNA is generally detectable in upper respiratory specimens during the acute phase of infection. The lowest concentration of SARS-CoV-2 viral copies this assay can detect is 138 copies/mL. A negative result does not preclude SARS-Cov-2 infection and should not be used as the sole basis for treatment or other patient management decisions. A negative result may occur with  improper specimen collection/handling, submission of specimen other than nasopharyngeal swab, presence of viral mutation(s)  within the areas targeted by this assay, and inadequate number of viral copies(<138 copies/mL). A negative result must be combined with clinical observations, patient history, and epidemiological information. The expected result is Negative.  Fact Sheet for Patients:  EntrepreneurPulse.com.au  Fact Sheet for Healthcare Providers:  IncredibleEmployment.be  This test is no t yet approved or cleared by the [place] FDA and  has been authorized for detection and/or diagnosis of SARS-CoV-2 by FDA under an Emergency Use Authorization (EUA). This EUA will remain  in effect (meaning this test can be used) for the duration of the COVID-19 declaration under Section 564(b)(1) of the Act, 21 U.S.C.section 360bbb-3(b)(1), unless the authorization is terminated  or revoked sooner.       Influenza A by PCR NEGATIVE NEGATIVE Final   Influenza B by PCR NEGATIVE NEGATIVE Final    Comment: (NOTE) The Xpert Xpress SARS-CoV-2/FLU/RSV plus assay is intended as an aid in the diagnosis of influenza from Nasopharyngeal swab specimens and should not be used as a sole basis for treatment. Nasal washings and aspirates are unacceptable for Xpert Xpress SARS-CoV-2/FLU/RSV testing.  Fact Sheet for Patients: EntrepreneurPulse.com.au  Fact Sheet for Healthcare Providers: IncredibleEmployment.be  This test is not yet approved or cleared by the [place] FDA and has been authorized for detection and/or diagnosis of SARS-CoV-2 by FDA under an Emergency Use Authorization (EUA). This EUA will remain in effect (meaning this test can be used) for the duration of the COVID-19 declaration under Section 564(b)(1) of the Act, 21 U.S.C. section 360bbb-3(b)(1), unless the authorization is terminated or revoked.  Performed at [facility], [address] [place], [place] [address]     Radiology Reports CT ABDOMEN PELVIS WO  CONTRAST  Result Date: 04/03/2021 CLINICAL DATA:  Flank pain, kidney stone suspected EXAM: CT ABDOMEN AND PELVIS WITHOUT CONTRAST TECHNIQUE: Multidetector CT imaging of the abdomen and pelvis was performed following the standard protocol without IV contrast. COMPARISON:  None. FINDINGS: Lower chest: Lung bases are clear.  Small hiatal hernia. Hepatobiliary: No focal liver abnormality. Gallstones are present. No biliary dilatation. Pancreas: Unremarkable. Spleen: Unremarkable. Adrenals/Urinary Tract: Adrenals are unremarkable. There is no hydronephrosis. No renal calculi. Bladder is unremarkable. Stomach/Bowel: Stomach is within normal limits apart from hiatal hernia. Bowel is normal in caliber. Vascular/Lymphatic: Aortic atherosclerosis. No enlarged lymph nodes. Reproductive: Status post hysterectomy. No adnexal masses. Other: No free fluid.  Abdominal wall is unremarkable. Musculoskeletal: Degenerative changes of the included spine. IMPRESSION: No urinary  tract calculi. Cholelithiasis. Small hiatal hernia. Aortic atherosclerosis. Electronically Signed   By: Macy Mis M.D.   On: 04/03/2021 16:31   DG Chest 2 View  Result Date: 04/03/2021 CLINICAL DATA:  Short of breath.  Right rib pain. EXAM: CHEST - 2 VIEW COMPARISON:  Chest CT 03/26/2021 FINDINGS: The heart size and mediastinal contours are within normal limits. Both lungs are clear. The visualized skeletal structures are unremarkable. IMPRESSION: No active cardiopulmonary disease. Electronically Signed   By: Franchot Gallo M.D.   On: 04/03/2021 15:48   NM Pulmonary Perfusion  Result Date: 04/03/2021 CLINICAL DATA:  PE suspected, low/intermediate prob, positive D-dimer. Chest pain and dyspnea for while. EXAM: NUCLEAR MEDICINE PERFUSION LUNG SCAN TECHNIQUE: Perfusion images were obtained in multiple projections after intravenous injection of radiopharmaceutical. Ventilation scans intentionally deferred if perfusion scan and chest x-ray adequate  for interpretation during COVID 19 epidemic. RADIOPHARMACEUTICALS:  4.3 mCi Tc-13m MAA IV COMPARISON:  Chest radiograph from earlier today. FINDINGS: No significant perfusion defects in either lung. IMPRESSION: No scintigraphic evidence of acute pulmonary embolism. Electronically Signed   By: Ilona Sorrel M.D.   On: 04/03/2021 19:16   NM Myocar Multi W/Spect W/Wall Motion / EF  Result Date: 03/07/2021   The study is low risk.   No ST deviation was noted. The ECG was negative for ischemia.   LV perfusion is abnormal. Defect 1: There is a small defect with mild reduction in uptake present in the apical anterior location(s) that is reversible. There is normal wall motion in the defect area. Consistent with artifact caused by variable breast attenuation. Defect 2: There is a small defect with mild reduction in uptake present in the basal inferolateral location(s) that is fixed. There is normal wall motion in the defect area. Consistent with artifact caused by breast attenuation.   Left ventricular function is normal. Nuclear stress EF: 53 %. Low risk study with evidence of variable breast attenuation artifact, no large ischemic territories and low normal LVEF at 53%.   ECHOCARDIOGRAM COMPLETE  Result Date: 03/07/2021    ECHOCARDIOGRAM REPORT   Patient Name:   Katherine Stokes Date of Exam: 03/07/2021 Medical Rec #:  [id number]          Height:       62.0 in Accession #:    [id number]         Weight:       227.6 lb Date of Birth:  10/09/1950          BSA:          2.020 m Patient Age:    36 years           BP:           126/64 mmHg Patient Gender: F                  HR:           80 bpm. Exam Location:  [facility] Procedure: 2D Echo, Cardiac Doppler and Color Doppler Indications:    Dyspnea  History:        Patient has no prior history of Echocardiogram examinations.                 Stroke, Signs/Symptoms:Dyspnea and CKD; Risk                 Factors:Hypertension, Diabetes, Dyslipidemia and Morbid obesity.   Sonographer:    Dustin Flock RDCS Referring Phys: [place]  1. Left ventricular ejection fraction, by estimation, is 60 to 65%. The left ventricle has normal function. The left ventricle has no regional wall motion abnormalities. There is mild left ventricular hypertrophy. Left ventricular diastolic parameters are consistent with Grade I diastolic dysfunction (impaired relaxation).  2. Right ventricular systolic function is normal. The right ventricular size is normal.  3. The mitral valve is grossly normal. Trivial mitral valve regurgitation.  4. The aortic valve is tricuspid. Aortic valve regurgitation is not visualized. No aortic stenosis is present.  5. The inferior vena cava is normal in size with greater than 50% respiratory variability, suggesting right atrial pressure of 3 mmHg. Comparison(s): No prior Echocardiogram. FINDINGS  Left Ventricle: Left ventricular ejection fraction, by estimation, is 60 to 65%. The left ventricle has normal function. The left ventricle has no regional wall motion abnormalities. The left ventricular internal cavity size was normal in size. There is  mild left ventricular hypertrophy. Left ventricular diastolic parameters are consistent with Grade I diastolic dysfunction (impaired relaxation). Right Ventricle: The right ventricular size is normal. No increase in right ventricular wall thickness. Right ventricular systolic function is normal. Left Atrium: Left atrial size was normal in size. Right Atrium: Right atrial size was normal in size. Pericardium: There is no evidence of pericardial effusion. Mitral Valve: The mitral valve is grossly normal. Trivial mitral valve regurgitation. Tricuspid Valve: The tricuspid valve is grossly normal. Tricuspid valve regurgitation is trivial. Aortic Valve: The aortic valve is tricuspid. There is mild aortic valve annular calcification. Aortic valve regurgitation is not visualized. No aortic stenosis is present.  Pulmonic Valve: The pulmonic valve was not well visualized. Pulmonic valve regurgitation is trivial. Aorta: The aortic root is normal in size and structure. Venous: The inferior vena cava is normal in size with greater than 50% respiratory variability, suggesting right atrial pressure of 3 mmHg. IAS/Shunts: No atrial level shunt detected by color flow Doppler.  LEFT VENTRICLE PLAX 2D LVIDd:         4.10 cm  Diastology LVIDs:         2.50 cm  LV e' medial:    4.46 cm/s LV PW:         1.20 cm  LV E/e' medial:  16.4 LV IVS:        1.10 cm  LV e' lateral:   6.74 cm/s LVOT diam:     2.10 cm  LV E/e' lateral: 10.9 LV SV:         65 LV SV Index:   32 LVOT Area:     3.46 cm  RIGHT VENTRICLE RV Basal diam:  2.80 cm RV S prime:     7.51 cm/s TAPSE (M-mode): 2.1 cm LEFT ATRIUM             Index       RIGHT ATRIUM          Index LA diam:        3.90 cm 1.93 cm/m  RA Area:     9.23 cm LA Vol (A2C):   23.9 ml 11.83 ml/m RA Volume:   17.30 ml 8.57 ml/m LA Vol (A4C):   23.7 ml 11.73 ml/m LA Biplane Vol: 26.1 ml 12.92 ml/m  AORTIC VALVE LVOT Vmax:   80.90 cm/s LVOT Vmean:  53.900 cm/s LVOT VTI:    0.189 m  AORTA Ao Root diam: 2.80 cm MITRAL VALVE MV Area (PHT): 5.02 cm    SHUNTS MV Decel Time: 151 msec    Systemic  VTI:  0.19 m MV E velocity: 73.30 cm/s  Systemic Diam: 2.10 cm MV A velocity: 99.80 cm/s MV E/A ratio:  0.73 Rozann Lesches MD Electronically signed by Rozann Lesches MD Signature Date/Time: 03/07/2021/4:24:23 PM    Final     SIGNED: Deatra James, MD, FHM. [facility],  Pager (please use [email or web address] to page/text) Please use Epic Secure Chat for non-urgent communication (7AM-7PM)  If 7PM-7AM, please contact night-coverage [email or web address], 04/04/2021, 11:57 AM

## 2021-04-04 NOTE — Progress Notes (Signed)
SLP Cancellation Note  Patient Details Name: Katherine Stokes MRN: [id number] DOB: 1950/07/25   Cancelled treatment:       Reason Eval/Treat Not Completed: Medical issues which prohibited therapy. BSE ordered however, Pt is NPO pending EGD this afternoon. ST will continue efforts.  Holliday Sheaffer H. Roddie Mc, CCC-SLP Speech Language Pathologist    Wende Bushy 04/04/2021, 11:35 AM

## 2021-04-04 NOTE — Consult Note (Signed)
Referring Provider: Deatra James, MD Primary Care Physician:  Neale Burly, MD Primary Gastroenterologist:  previously unassigned, Dr. Jenetta Downer  Reason for Consultation: Dysphagia, abdominal pain, new anemia  HPI: Katherine Stokes is a 70 y.o. female with history of asthma, chronic kidney disease, hypertension, GERD, CVA, hyperlipidemia, diabetes who presented to the emergency department for evaluation of multiple symptoms occurring over the past 3 months.  Over the past several months patient has had multiple symptoms that she has been having evaluated.  Complains of dyspnea on exertion, right-sided chest wall pain.  Going to her mailbox she gets short of breath.  When she takes a deep breath she has pain in the right side of her chest that extends to the axilla and into the shoulder blade.  Pain is also exacerbated by movement.  Took a steroid Dosepak without relief.  She has been seen by cardiology with unremarkable stress test and normal EF.  Pulmonology appointment pending.  Chest CT at [facility] earlier this month showed gallstones.  She was seen by Dr. Arnoldo Morale yesterday for consideration of cholecystectomy but he did not feel like her symptoms were associated to gallstones.  He recommended she continue with upcoming appointment with GI and pulmonology.   Patient has also noted dysphagia to solid foods and pills.  She feels like food and pills are sticking in the epigastric region.  She has to wash down with liquid.  With time eventually does go down.  Did not have typical heartburn until about 3 days ago.  Notes that she more recently was placed on pantoprazole and Carafate by her PCP for Stokes of the symptoms.  No significant improvement.  Denies vomiting.  Bowel movements are regular.  Her stools are black over the past 1 week.  Last 1 was the day prior to admission.  Denies Pepto or other bismuth products.  No iron.  Patient states since admission her breathing seems to be  improved with breathing treatments.  Pain medication helping her right-sided chest pain.  In the ED:  Labs: Glucose 139, BUN 36, creatinine 2.44 (creatinine of 1.48 September 2022), albumin 3.8, AST 15, ALT 16, total bilirubin 0.5, alkaline phosphatase 45.  White blood cell count 11,700, hemoglobin 9.9 (down from 11.25 February 2021), platelets 403,000.  Hemoglobin down to 9.0 today.  Lipase 23.  Nuc med study negative for acute PE.  CT abdomen pelvis without contrast showed cholelithiasis, small hiatal hernia otherwise unremarkable.  Chest x-ray with no active cardiopulmonary disease.  She had a chest CT at [facility] March 26, 2021, no acute intrathoracic abnormality with limited evaluation on this noncontrast study, aortic atherosclerosis including at least three-vessel coronary artery calcification, small hiatal hernia, cholelithiasis.    Prior to Admission medications   Medication Sig Start Date End Date Taking? Authorizing Provider  albuterol (PROVENTIL) (2.5 MG/3ML) 0.083% nebulizer solution Take 2.5 mg by nebulization every 6 (six) hours as needed for wheezing or shortness of breath.   Yes [provider]  allopurinol (ZYLOPRIM) 300 MG tablet Take 300 mg by mouth daily.   Yes [provider]  amLODipine (NORVASC) 5 MG tablet Take 5 mg by mouth daily.   Yes [provider]  aspirin EC 81 MG tablet Take 162 mg by mouth daily. Swallow whole.   Yes [provider]  benazepril-hydrochlorthiazide (LOTENSIN HCT) 20-25 MG tablet Take 1 tablet by mouth daily.   Yes [provider]  carvedilol (COREG) 25 MG tablet Take 25 mg by mouth  2 (two) times daily with a meal.   Yes [provider]  cholecalciferol (VITAMIN D) 25 MCG (1000 UNIT) tablet Take 1,000 Units by mouth daily.   Yes [provider]  diclofenac Sodium (VOLTAREN) 1 % GEL Apply 2 g topically 4 (four) times daily.   Yes [provider]  famotidine (PEPCID) 40 MG  tablet Take 40 mg by mouth daily.   Yes [provider]  Fluticasone-Umeclidin-Vilant (TRELEGY ELLIPTA) 100-62.5-25 MCG/INH AEPB Inhale 1 puff into the lungs daily as needed (shortness of breath).   Yes [provider]  gemfibrozil (LOPID) 600 MG tablet Take 600 mg by mouth 2 (two) times daily before a meal.   Yes [provider]  insulin aspart (NOVOLOG FLEXPEN) 100 UNIT/ML FlexPen Inject 25 Units into the skin 3 (three) times daily with meals.   Yes [provider]  insulin glargine (LANTUS SOLOSTAR) 100 UNIT/ML Solostar Pen Inject 40 Units into the skin at bedtime.   Yes [provider]  latanoprost (XALATAN) 0.005 % ophthalmic solution Place 1 drop into both eyes at bedtime. 02/08/21  Yes [provider]  linaCLOtide (LINZESS PO) Take 1 tablet by mouth daily as needed (constipation).   Yes [provider]  Omega-3 Fatty Acids (FISH OIL) 1000 MG CAPS Take 1 capsule by mouth 3 (three) times daily.   Yes [provider]  pantoprazole (PROTONIX) 40 MG tablet Take 40 mg by mouth daily.   Yes [provider]  rosuvastatin (CRESTOR) 40 MG tablet Take 40 mg by mouth daily. 03/27/21  Yes [provider]  sodium bicarbonate 650 MG tablet Take 650 mg by mouth 3 (three) times daily.   Yes [provider]  vitamin B-12 (CYANOCOBALAMIN) 500 MCG tablet Take 500 mcg by mouth daily.   Yes [provider]  albuterol (VENTOLIN HFA) 108 (90 Base) MCG/ACT inhaler Inhale 2 puffs into the lungs every 6 (six) hours as needed for wheezing or shortness of breath. Patient not taking: Reported on 04/03/2021    [provider]        [provider]            Current Facility-Administered Medications  Medication Dose Route Frequency Provider Last Rate Last Admin   0.9 %  sodium chloride infusion   Intravenous Continuous Shahmehdi, Seyed A, MD 100 mL/hr at 04/04/21 0801 Rate Change at 04/04/21 0801    acetaminophen (TYLENOL) tablet 650 mg  650 mg Oral Q6H PRN Zierle-Ghosh, Asia B, DO       Or   acetaminophen (TYLENOL) suppository 650 mg  650 mg Rectal Q6H PRN Zierle-Ghosh, Asia B, DO       albuterol (PROVENTIL) (2.5 MG/3ML) 0.083% nebulizer solution 2.5 mg  2.5 mg Nebulization Q6H PRN Zierle-Ghosh, Asia B, DO       amLODipine (NORVASC) tablet 5 mg  5 mg Oral Daily Zierle-Ghosh, Asia B, DO       aspirin EC tablet 162 mg  162 mg Oral Daily Zierle-Ghosh, Asia B, DO       carvedilol (COREG) tablet 25 mg  25 mg Oral BID WC Zierle-Ghosh, Asia B, DO       famotidine (PEPCID) tablet 40 mg  40 mg Oral Daily Zierle-Ghosh, Asia B, DO       umeclidinium bromide (INCRUSE ELLIPTA) 62.5 MCG/INH 1 puff  1 puff Inhalation Daily PRN Zierle-Ghosh, Asia B, DO       And   fluticasone furoate-vilanterol (BREO ELLIPTA) 100-25 MCG/INH 1 puff  1 puff Inhalation Daily PRN Zierle-Ghosh, Asia B, DO       gemfibrozil (LOPID) tablet 600 mg  600 mg Oral BID AC Zierle-Ghosh, Asia B, DO       HYDROmorphone (DILAUDID) injection 0.5 mg  0.5 mg Intravenous Once Zierle-Ghosh, Asia B, DO       insulin aspart (novoLOG) injection 0-15 Units  0-15 Units Subcutaneous TID WC Zierle-Ghosh, Asia B, DO       insulin aspart (novoLOG) injection 0-5 Units  0-5 Units Subcutaneous QHS Zierle-Ghosh, Asia B, DO       insulin detemir (LEVEMIR) injection 30 Units  30 Units Subcutaneous QHS Zierle-Ghosh, Asia B, DO       ipratropium-albuterol (DUONEB) 0.5-2.5 (3) MG/3ML nebulizer solution 3 mL  3 mL Nebulization TID Zierle-Ghosh, Asia B, DO   3 mL at 04/04/21 0726   ondansetron (ZOFRAN) tablet 4 mg  4 mg Oral Q6H PRN Zierle-Ghosh, Asia B, DO       Or   ondansetron (ZOFRAN) injection 4 mg  4 mg Intravenous Q6H PRN Zierle-Ghosh, Asia B, DO       oxyCODONE (Oxy IR/ROXICODONE) immediate release tablet 5 mg  5 mg Oral Q4H PRN Zierle-Ghosh, Asia B, DO   5 mg at 04/03/21 2346   pantoprazole (PROTONIX) EC tablet 40 mg  40 mg Oral Daily Zierle-Ghosh,  Asia B, DO       rosuvastatin (CRESTOR) tablet 40 mg  40 mg Oral Daily Zierle-Ghosh, Asia B, DO       sodium bicarbonate tablet 650 mg  650 mg Oral TID Zierle-Ghosh, Asia B, DO        Allergies as of 04/03/2021 - Review Complete 04/03/2021  Allergen Reaction Noted   Penicillins     Sulfa antibiotics Rash     Past Medical History:  Diagnosis Date   Asthma    Chronic kidney disease (CKD), stage II (mild)    Essential hypertension    GERD (gastroesophageal reflux disease)    History of stroke    Residual right leg weakness   Mixed hyperlipidemia    Type 2 diabetes mellitus (HCC)     Past Surgical History:  Procedure Laterality Date   ABDOMINAL HYSTERECTOMY     TONSILLECTOMY      Family History  Problem Relation Age of Onset   Dementia Mother    Colon cancer Father        age greater than 72    Social History   Socioeconomic History   Marital status: Divorced    Spouse name: Not on file   Number of children: Not on file   Years of education: Not on file   Highest education level: Not on file  Occupational History   Not on file  Tobacco Use   Smoking status: Never   Smokeless tobacco: Never  Substance and Sexual Activity   Alcohol use: Never   Drug use: Never   Sexual activity: Not on file  Other Topics Concern   Not on file  Social History Narrative   Not on file   Social Determinants of Health   Financial Resource Strain: Not on file  Food Insecurity: Not on file  Transportation Needs: Not on file  Physical Activity: Not on file  Stress: Not on file  Social Connections: Not on file  Intimate Partner Violence: Not on file     ROS:  General: Negative for anorexia, weight loss, fever, chills, fatigue, weakness. Eyes: Negative for vision changes.  ENT: Negative for  hoarseness, difficulty swallowing , nasal congestion. CV: Negative for chest pain, angina, palpitations, dyspnea on exertion, peripheral edema.  Respiratory: Negative for dyspnea at rest,  dyspnea on exertion, cough, sputum, wheezing.  GI: See history of present illness. GU:  Negative for dysuria, hematuria, urinary incontinence, urinary frequency, nocturnal urination.  MS: Negative for joint pain, low back pain.  Derm: Negative for rash or itching.  Neuro: Negative for weakness, abnormal sensation, seizure, frequent headaches, memory loss, confusion.  Psych: Negative for anxiety, depression, suicidal ideation, hallucinations.  Endo: Negative for unusual weight change.  Heme: Negative for bruising or bleeding. Allergy: Negative for rash or hives.       Physical Examination: Vital signs in last 24 hours: Temp:  [97.6 F (36.4 C)-98 F (36.7 C)] 98 F (36.7 C) (10/14 0555) Pulse Rate:  [65-74] 71 (10/14 0555) Resp:  [14-24] 18 (10/14 0555) BP: (95-135)/(46-76) 129/55 (10/14 0555) SpO2:  [94 %-100 %] 100 % (10/14 0726) Weight:  [99.3 kg-100.7 kg] 100.7 kg (10/14 0604)    General: Well-nourished, well-developed in no acute distress.  Daughter, Katherine Stokes at bedside. Head: Normocephalic, atraumatic.   Eyes: Conjunctiva pink, no icterus. Mouth: Oropharyngeal mucosa moist and pink , no lesions erythema or exudate. Neck: Supple without thyromegaly, masses, or lymphadenopathy.  Lungs: Clear to auscultation bilaterally.  Heart: Regular rate and rhythm, no murmurs rubs or gallops.  Abdomen: Bowel sounds are normal, nontender, nondistended, no hepatosplenomegaly or masses, no abdominal bruits or    hernia , no rebound or guarding.   Rectal: Not performed Extremities: No lower extremity edema, clubbing, deformity.  Neuro: Alert and oriented x 4 , grossly normal neurologically.  Skin: Warm and dry, no rash or jaundice.   Psych: Alert and cooperative, normal mood and affect.        Intake/Output from previous day: No intake/output data recorded. Intake/Output this shift: No intake/output data recorded.  Lab Results: CBC Recent Labs    04/03/21 1420 04/04/21 0543  WBC  11.7* 11.3*  HGB 9.9* 9.0*  HCT 31.7* 28.9*  MCV 93.0 92.6  PLT 403* 360   BMET Recent Labs    04/03/21 1420 04/04/21 0543  NA 139 138  K 4.3 3.9  CL 110 110  CO2 20* 21*  GLUCOSE 139* 143*  BUN 36* 33*  CREATININE 2.44* 2.23*  CALCIUM 9.5 8.9   LFT Recent Labs    04/03/21 1420 04/04/21 0543  BILITOT 0.5 0.2*  ALKPHOS 57 45  AST 15 13*  ALT 16 13  PROT 8.1 7.0  ALBUMIN 3.8 3.3*    Lipase Recent Labs    04/03/21 1420  LIPASE 23    PT/INR No results for input(s): LABPROT, INR in the last 72 hours.    Imaging Studies: CT ABDOMEN PELVIS WO CONTRAST  Result Date: 04/03/2021 CLINICAL DATA:  Flank pain, kidney stone suspected EXAM: CT ABDOMEN AND PELVIS WITHOUT CONTRAST TECHNIQUE: Multidetector CT imaging of the abdomen and pelvis was performed following the standard protocol without IV contrast. COMPARISON:  None. FINDINGS: Lower chest: Lung bases are clear.  Small hiatal hernia. Hepatobiliary: No focal liver abnormality. Gallstones are present. No biliary dilatation. Pancreas: Unremarkable. Spleen: Unremarkable. Adrenals/Urinary Tract: Adrenals are unremarkable. There is no hydronephrosis. No renal calculi. Bladder is unremarkable. Stomach/Bowel: Stomach is within normal limits apart from hiatal hernia. Bowel is normal in caliber. Vascular/Lymphatic: Aortic atherosclerosis. No enlarged lymph nodes. Reproductive: Status post hysterectomy. No adnexal masses. Other: No free fluid.  Abdominal wall is unremarkable. Musculoskeletal: Degenerative changes of  the included spine. IMPRESSION: No urinary tract calculi. Cholelithiasis. Small hiatal hernia. Aortic atherosclerosis. Electronically Signed   By: Macy Mis M.D.   On: 04/03/2021 16:31   DG Chest 2 View  Result Date: 04/03/2021 CLINICAL DATA:  Short of breath.  Right rib pain. EXAM: CHEST - 2 VIEW COMPARISON:  Chest CT 03/26/2021 FINDINGS: The heart size and mediastinal contours are within normal limits. Both lungs  are clear. The visualized skeletal structures are unremarkable. IMPRESSION: No active cardiopulmonary disease. Electronically Signed   By: Franchot Gallo M.D.   On: 04/03/2021 15:48   NM Pulmonary Perfusion  Result Date: 04/03/2021 CLINICAL DATA:  PE suspected, low/intermediate prob, positive D-dimer. Chest pain and dyspnea for while. EXAM: NUCLEAR MEDICINE PERFUSION LUNG SCAN TECHNIQUE: Perfusion images were obtained in multiple projections after intravenous injection of radiopharmaceutical. Ventilation scans intentionally deferred if perfusion scan and chest x-ray adequate for interpretation during COVID 19 epidemic. RADIOPHARMACEUTICALS:  4.3 mCi Tc-41m MAA IV COMPARISON:  Chest radiograph from earlier today. FINDINGS: No significant perfusion defects in either lung. IMPRESSION: No scintigraphic evidence of acute pulmonary embolism. Electronically Signed   By: Ilona Sorrel M.D.   On: 04/03/2021 19:16   NM Myocar Multi W/Spect W/Wall Motion / EF  Result Date: 03/07/2021   The study is low risk.   No ST deviation was noted. The ECG was negative for ischemia.   LV perfusion is abnormal. Defect 1: There is a small defect with mild reduction in uptake present in the apical anterior location(s) that is reversible. There is normal wall motion in the defect area. Consistent with artifact caused by variable breast attenuation. Defect 2: There is a small defect with mild reduction in uptake present in the basal inferolateral location(s) that is fixed. There is normal wall motion in the defect area. Consistent with artifact caused by breast attenuation.   Left ventricular function is normal. Nuclear stress EF: 53 %. Low risk study with evidence of variable breast attenuation artifact, no large ischemic territories and low normal LVEF at 53%.   ECHOCARDIOGRAM COMPLETE  Result Date: 03/07/2021    ECHOCARDIOGRAM REPORT   Patient Name:   Atziri RAY Laughter Date of Exam: 03/07/2021 Medical Rec #:  [id number]           Height:       62.0 in Accession #:    [id number]         Weight:       227.6 lb Date of Birth:  23-Feb-1951          BSA:          2.020 m Patient Age:    52 years           BP:           126/64 mmHg Patient Gender: F                  HR:           80 bpm. Exam Location:  [facility] Procedure: 2D Echo, Cardiac Doppler and Color Doppler Indications:    Dyspnea  History:        Patient has no prior history of Echocardiogram examinations.                 Stroke, Signs/Symptoms:Dyspnea and CKD; Risk                 Factors:Hypertension, Diabetes, Dyslipidemia and Morbid obesity.  Sonographer:    Dustin Flock RDCS Referring Phys:  Seba Dalkai  1. Left ventricular ejection fraction, by estimation, is 60 to 65%. The left ventricle has normal function. The left ventricle has no regional wall motion abnormalities. There is mild left ventricular hypertrophy. Left ventricular diastolic parameters are consistent with Grade I diastolic dysfunction (impaired relaxation).  2. Right ventricular systolic function is normal. The right ventricular size is normal.  3. The mitral valve is grossly normal. Trivial mitral valve regurgitation.  4. The aortic valve is tricuspid. Aortic valve regurgitation is not visualized. No aortic stenosis is present.  5. The inferior vena cava is normal in size with greater than 50% respiratory variability, suggesting right atrial pressure of 3 mmHg. Comparison(s): No prior Echocardiogram. FINDINGS  Left Ventricle: Left ventricular ejection fraction, by estimation, is 60 to 65%. The left ventricle has normal function. The left ventricle has no regional wall motion abnormalities. The left ventricular internal cavity size was normal in size. There is  mild left ventricular hypertrophy. Left ventricular diastolic parameters are consistent with Grade I diastolic dysfunction (impaired relaxation). Right Ventricle: The right ventricular size is normal. No increase in right ventricular  wall thickness. Right ventricular systolic function is normal. Left Atrium: Left atrial size was normal in size. Right Atrium: Right atrial size was normal in size. Pericardium: There is no evidence of pericardial effusion. Mitral Valve: The mitral valve is grossly normal. Trivial mitral valve regurgitation. Tricuspid Valve: The tricuspid valve is grossly normal. Tricuspid valve regurgitation is trivial. Aortic Valve: The aortic valve is tricuspid. There is mild aortic valve annular calcification. Aortic valve regurgitation is not visualized. No aortic stenosis is present. Pulmonic Valve: The pulmonic valve was not well visualized. Pulmonic valve regurgitation is trivial. Aorta: The aortic root is normal in size and structure. Venous: The inferior vena cava is normal in size with greater than 50% respiratory variability, suggesting right atrial pressure of 3 mmHg. IAS/Shunts: No atrial level shunt detected by color flow Doppler.  LEFT VENTRICLE PLAX 2D LVIDd:         4.10 cm  Diastology LVIDs:         2.50 cm  LV e' medial:    4.46 cm/s LV PW:         1.20 cm  LV E/e' medial:  16.4 LV IVS:        1.10 cm  LV e' lateral:   6.74 cm/s LVOT diam:     2.10 cm  LV E/e' lateral: 10.9 LV SV:         65 LV SV Index:   32 LVOT Area:     3.46 cm  RIGHT VENTRICLE RV Basal diam:  2.80 cm RV S prime:     7.51 cm/s TAPSE (M-mode): 2.1 cm LEFT ATRIUM             Index       RIGHT ATRIUM          Index LA diam:        3.90 cm 1.93 cm/m  RA Area:     9.23 cm LA Vol (A2C):   23.9 ml 11.83 ml/m RA Volume:   17.30 ml 8.57 ml/m LA Vol (A4C):   23.7 ml 11.73 ml/m LA Biplane Vol: 26.1 ml 12.92 ml/m  AORTIC VALVE LVOT Vmax:   80.90 cm/s LVOT Vmean:  53.900 cm/s LVOT VTI:    0.189 m  AORTA Ao Root diam: 2.80 cm MITRAL VALVE MV Area (PHT): 5.02 cm    SHUNTS MV Decel Time:  151 msec    Systemic VTI:  0.19 m MV E velocity: 73.30 cm/s  Systemic Diam: 2.10 cm MV A velocity: 99.80 cm/s MV E/A ratio:  0.73 Rozann Lesches MD Electronically  signed by Rozann Lesches MD Signature Date/Time: 03/07/2021/4:24:23 PM    Final   Tyson Dense week]   Impression: 70 year old female presenting with several month history of dyspnea on exertion, right-sided chest pain (lateral aspect extending into the axillary region and shoulder blade), dysphagia, melena.  Cardiology work-up unrevealing.  No evidence of PE on imaging.  GI consulted for dysphagia, abdominal pain, anemia.  Dysphagia: Occurring for several months.  No improvement on PPI therapy.  Describes pills and solid food sticking in the epigastric region.  Suspect esophageal stricture.  Anemia: Last month her hemoglobin was 11.6, down to 9.0 today.  She also has chronic kidney disease which may be contributing to it.  Folate level normal.  She describes melena for 1 week, Hemoccult status unknown.  Remote colonoscopy about 8 years ago per patient, normal.  No prior upper endoscopy.  Given dysphagia, questionable melena, would recommend upper endoscopy initially.  She may have gastritis, ulcer.  Abdominal pain: She denies abdominal pain.  Her pain is in the lateral right chest area extending into the axillary region and right shoulder blade.  Worse with movement and taking deep breaths.  Unrelated to meals.  Not likely associated with GI etiology.    Plan: EGD with possible esophageal dilation today.  I have discussed the risks, alternatives, benefits with regards to but not limited to the risk of reaction to medication, bleeding, infection, perforation and the patient is agreeable to proceed. Written consent to be obtained. Continue pantoprazole 40 mg daily for now.  May make adjustments based on endoscopy findings. Check iron/TIBC/ferritin, B12.  We would like to thank you for the opportunity to participate in the care of Katherine Stokes.  Laureen Ochs. Bernarda Caffey [facility] [phone] 10/14/20228:45 AM    LOS: 0 days

## 2021-04-04 NOTE — Anesthesia Postprocedure Evaluation (Signed)
Anesthesia Post Note  Patient: Katherine Stokes  Procedure(s) Performed: ESOPHAGOGASTRODUODENOSCOPY (EGD) WITH PROPOFOL ESOPHAGEAL DILATION POLYPECTOMY  Patient location during evaluation: PACU Anesthesia Type: General Level of consciousness: awake and alert and oriented Pain management: pain level controlled Vital Signs Assessment: post-procedure vital signs reviewed and stable Respiratory status: spontaneous breathing, nonlabored ventilation, respiratory function stable and patient connected to nasal cannula oxygen Cardiovascular status: blood pressure returned to baseline and stable Postop Assessment: no apparent nausea or vomiting Anesthetic complications: no   No notable events documented.   Last Vitals:  Vitals:   04/04/21 1422 04/04/21 1512  BP: (!) 113/51   Pulse: 73   Resp: 16   Temp: 36.4 C   SpO2: 100% 100%    Last Pain:  Vitals:   04/04/21 1510  TempSrc:   PainSc: 0-No pain                 Katherine Stokes C Jeffery Gammell

## 2021-04-04 NOTE — Anesthesia Preprocedure Evaluation (Signed)
Anesthesia Evaluation  Patient identified by MRN, date of birth, ID band Patient awake    Reviewed: Allergy & Precautions, NPO status , Patient's Chart, lab work & pertinent test results  History of Anesthesia Complications Negative for: history of anesthetic complications  Airway        Dental  (+) Dental Advisory Given   Pulmonary asthma ,    Pulmonary exam normal breath sounds clear to auscultation       Cardiovascular hypertension, Pt. on medications Normal cardiovascular exam Rhythm:Regular Rate:Normal  1. Left ventricular ejection fraction, by estimation, is 60 to 65%. The left ventricle has normal function. The left ventricle has no regional wall motion abnormalities. There is mild left ventricular hypertrophy.  Left ventricular diastolic parameters  are consistent with Grade I diastolic dysfunction (impaired relaxation).  2. Right ventricular systolic function is normal. The right ventricular size is normal.  3. The mitral valve is grossly normal. Trivial mitral valve  regurgitation.  4. The aortic valve is tricuspid. Aortic valve regurgitation is not visualized. No aortic stenosis is present.  5. The inferior vena cava is normal in size with greater than 50% respiratory variability, suggesting right atrial pressure of 3 mmHg   Neuro/Psych CVA (right leg weakness), Residual Symptoms negative psych ROS   GI/Hepatic Neg liver ROS, GERD  Medicated,  Endo/Other  diabetes, Poorly Controlled, Type 2, Oral Hypoglycemic Agents  Renal/GU Renal InsufficiencyRenal disease     Musculoskeletal negative musculoskeletal ROS (+)   Abdominal   Peds  Hematology  (+) anemia ,   Anesthesia Other Findings 03-Apr-2021 16:53:10 [facility]-ED ROUTINE RECORD 07/26/1950 (62 yr) Female Black Test XTG:GYIRS Pain Vent. rate 72 BPM PR interval 200 ms QRS duration 74 ms QT/QTcB 394/431 ms P-R-T axes 44 18  41 Normal sinus rhythm Normal ECG Confirmed by Nanda Quinton [id number]) on 04/03/2021 4:55:50 PM  Reproductive/Obstetrics negative OB ROS                             Anesthesia Physical Anesthesia Plan  ASA: 3  Anesthesia Plan: General   Post-op Pain Management:    Induction: Intravenous  PONV Risk Score and Plan: TIVA  Airway Management Planned: Nasal Cannula and Natural Airway  Additional Equipment:   Intra-op Plan:   Post-operative Plan:   Informed Consent: I have reviewed the patients History and Physical, chart, labs and discussed the procedure including the risks, benefits and alternatives for the proposed anesthesia with the patient or authorized representative who has indicated his/her understanding and acceptance.     Dental advisory given  Plan Discussed with: CRNA and Surgeon  Anesthesia Plan Comments:         Anesthesia Quick Evaluation

## 2021-04-04 NOTE — Progress Notes (Signed)
Initial Nutrition Assessment  DOCUMENTATION CODES:   Morbid obesity  INTERVENTION:  Follow results SLP eval,  add ONS as appropriate   NUTRITION DIAGNOSIS:   Inadequate oral intake related to decreased appetite (the past 2 weeks per daughter) as evidenced by per patient/family report.   GOAL:  Patient will meet greater than or equal to 90% of their needs   MONITOR:  PO intake, Supplement acceptance, Labs, Diet advancement  REASON FOR ASSESSMENT:   Malnutrition Screening Tool    ASSESSMENT: RD working remotely.  Patient is a 70 yo female with hx of DM2, GERD, CKD2 and stroke. She presents with complaint of dysphagia, abdominal pain, AKI and anemia.   CT abdomen pelvis- showed cholelithiasis, small hiatal hernia.  GI consulted. Patient is NPO for EGD later today.   Spoke  with patient daughter Becky Sax by telephone. Patient has decreased her intake over the past couple of weeks. She follows a low sodium diabetic diet at home. Eating pattern - 3 meals daily with unsweetened beverages.  ST evaluation pending.   Minimal weight changes noted per review. Usual weight 217-220 lb per daughter.   Medications reviewed and include: Pepcid, Insulin, Levemir, Protonix and sodium  bicarbonate TID.   Labs: BMP Latest Ref Rng & Units 04/04/2021 04/03/2021 03/07/2021  Glucose 70 - 99 mg/dL 143(H) 139(H) 257(H)  BUN 8 - 23 mg/dL 33(H) 36(H) 28(H)  Creatinine 0.44 - 1.00 mg/dL 2.23(H) 2.44(H) 1.48(H)  Sodium 135 - 145 mmol/L 138 139 139  Potassium 3.5 - 5.1 mmol/L 3.9 4.3 3.8  Chloride 98 - 111 mmol/L 110 110 109  CO2 22 - 32 mmol/L 21(L) 20(L) 23  Calcium 8.9 - 10.3 mg/dL 8.9 9.5 9.3      NUTRITION - FOCUSED PHYSICAL EXAM: Unable to complete Nutrition-Focused physical exam at this time.  RD working remotely.    Diet Order:   Diet Order             Diet NPO time specified Except for: Ice Chips, Sips with Meds  Diet effective now                   EDUCATION NEEDS:   Education needs have been addressed  Skin:  Skin Assessment: Reviewed RN Assessment  Last BM:  unknown  Height:   Ht Readings from Last 1 Encounters:  04/04/21 5\' 2"  (1.575 m)    Weight:   Wt Readings from Last 1 Encounters:  04/04/21 100.7 kg    Ideal Body Weight:   50 kg   BMI:  Body mass index is 40.6 kg/m.  Estimated Nutritional Needs:   Kcal:  1500-1700  Protein:  70-75 gr  Fluid:  >1600 ml daily  Colman Cater MS,RD,CSG,LDN Contact: Shea Evans

## 2021-04-05 DIAGNOSIS — E1169 Type 2 diabetes mellitus with other specified complication: Secondary | ICD-10-CM

## 2021-04-05 DIAGNOSIS — R1314 Dysphagia, pharyngoesophageal phase: Secondary | ICD-10-CM

## 2021-04-05 DIAGNOSIS — D509 Iron deficiency anemia, unspecified: Secondary | ICD-10-CM

## 2021-04-05 DIAGNOSIS — I10 Essential (primary) hypertension: Secondary | ICD-10-CM | POA: Diagnosis not present

## 2021-04-05 DIAGNOSIS — Z794 Long term (current) use of insulin: Secondary | ICD-10-CM

## 2021-04-05 LAB — BASIC METABOLIC PANEL
Anion gap: 6 (ref 5–15)
BUN: 27 mg/dL — ABNORMAL HIGH (ref 8–23)
CO2: 22 mmol/L (ref 22–32)
Calcium: 8.8 mg/dL — ABNORMAL LOW (ref 8.9–10.3)
Chloride: 111 mmol/L (ref 98–111)
Creatinine, Ser: 2.13 mg/dL — ABNORMAL HIGH (ref 0.44–1.00)
GFR, Estimated: 24 mL/min — ABNORMAL LOW (ref >60)
Glucose, Bld: 96 mg/dL (ref 70–99)
Potassium: 3.8 mmol/L (ref 3.5–5.1)
Sodium: 139 mmol/L (ref 135–145)

## 2021-04-05 LAB — CBC
HCT: 27.6 % — ABNORMAL LOW (ref 36.0–46.0)
Hemoglobin: 8.6 g/dL — ABNORMAL LOW (ref 12.0–15.0)
MCH: 28.6 pg (ref 26.0–34.0)
MCHC: 31.2 g/dL (ref 30.0–36.0)
MCV: 91.7 fL (ref 80.0–100.0)
Platelets: 333 10*3/uL (ref 150–400)
RBC: 3.01 MIL/uL — ABNORMAL LOW (ref 3.87–5.11)
RDW: 15.7 % — ABNORMAL HIGH (ref 11.5–15.5)
WBC: 11.2 10*3/uL — ABNORMAL HIGH (ref 4.0–10.5)
nRBC: 0 % (ref 0.0–0.2)

## 2021-04-05 LAB — GLUCOSE, CAPILLARY: Glucose-Capillary: 109 mg/dL — ABNORMAL HIGH (ref 70–99)

## 2021-04-05 MED ORDER — SENNOSIDES-DOCUSATE SODIUM 8.6-50 MG PO TABS
2.0000 | ORAL_TABLET | Freq: Once | ORAL | Status: AC
  Administered 2021-04-05: 2 via ORAL
  Filled 2021-04-05: qty 2

## 2021-04-05 MED ORDER — DOCUSATE SODIUM 100 MG PO CAPS: 100.0000 mg | ORAL_CAPSULE | Freq: Every day | ORAL | 2 refills | Status: AC

## 2021-04-05 MED ORDER — PANTOPRAZOLE SODIUM 40 MG PO TBEC: 40.0000 mg | DELAYED_RELEASE_TABLET | Freq: Two times a day (BID) | ORAL | 0 refills | Status: DC

## 2021-04-05 MED ORDER — POLYETHYLENE GLYCOL 3350 17 G PO PACK
17.0000 g | PACK | Freq: Every day | ORAL | Status: DC
  Administered 2021-04-05: 17 g via ORAL
  Filled 2021-04-05: qty 1

## 2021-04-05 MED ORDER — OXYCODONE HCL 5 MG PO TABS: 5.0000 mg | ORAL_TABLET | ORAL | 0 refills | Status: AC | PRN

## 2021-04-05 MED ORDER — FERROUS SULFATE 325 (65 FE) MG PO TABS: 325.0000 mg | ORAL_TABLET | Freq: Every day | ORAL | 3 refills | Status: DC

## 2021-04-05 NOTE — Discharge Summary (Signed)
Physician Discharge Summary [facility]    Patient: Katherine Stokes                   Admit date: 04/03/2021   DOB: 1950/06/23             Discharge date:04/05/2021/9:57 AM CXK:[id number]                          PCP: Neale Burly, MD  Disposition: HOME  Recommendations for Outpatient Follow-up:   Follow up: Gastroenterologist in 2 to 3 weeks Continue Protonix 40 mg p.o. twice daily per gastroenterology recommendations for 4 weeks. Follow with PCP and likely will need referral to nephrologist, to monitor kidney function  Discharge Condition: Stable   Code Status:   Code Status: Full Code  Diet recommendation: Cardiac diet   Discharge Diagnoses:    Active Problems:   Essential hypertension   DM II (diabetes mellitus, type II), controlled ([place])   Anemia   Dysphagia   AKI (acute kidney injury) ([place])   Abdominal pain   Anemia associated with diabetes mellitus ([place])   History of Present Illness/ Hospital Course Katherine Stokes Summary:   Katherine Stokes  is a 70 y.o. female, with history of CKD, essential hypertension, GERD, mixed hyperlipidemia, diabetes mellitus type 2, presents the ED with a chief complaint of dyspnea and right upper quadrant pain.   She has been working this up in the outpatient setting.  She had a stress test 1 month ago.  She was prescribed PPI and Carafate.  She has been scheduled to see GI in 2023.  She is also scheduled to see a pulmonologist.  She had a CT without contrast of her abdomen that showed gallstones.  She was referred to Dr. Arnoldo Morale who she saw the day of presentation... Reported symptoms not consistent with biliary colic not indicated cholecystectomy at this time.   ED Temp 97.6, HR 66-73, BP135/69, RR 14-24, blood pressure 94/46 VQ shows no PE EKG shows a heart rate of 72, normal sinus rhythm, QTC 431 Troponins flat at 5 and 5 CT abdomen pelvis showing no urinary tract calculi.  Cholelithiasis.  Small hiatal hernia.  Chest x-ray  shows no active cardiopulmonary disease Slight leukocytosis 11.7, hemoglobin stable 9.9 Chemistry panel reveals a AKI with a creatinine of 2.44  Active Problems:   AKI (acute kidney injury) ([place]) Likely due to dehydration, poor p.o. intake -Responded somewhat to IV fluid resuscitation, Recommended patient to abstain from any nephrotoxin, follow-up as an outpatient PCP and possible referral to nephrologist BUN 27, creatinine 2.13 today  Dyspepsia/dysphagia/abdominal pain -GI consulte, S/P  EGD on 04/04/2021 Finding consistent with esophageal stricture, status postdilatation, 2 polyp removal at the pylorus.  12 mm inflammatory changes and stigmata due to recent bleeding.  Clip x1, cauterization -GI recommended Protonix 40 mg p.o. twice daily x4 weeks Ferrous sulfate 325 mg daily, follow-up with pathology and GI as an outpatient  -Speech also consulted for evaluation -As needed analgesics   Acute on chronic anemia -Baseline hemoglobin 11.6 on admission 9.9, -GI following plan for EGD today -H&H, anemia work-up, iron studies, -Total iron 30, TIBC 295, percent sat 10, B12 1308, all within normal limits -Initiating iron supplements   Acute renal insufficiency -Baseline creatinine 1.48 -Status post 1 L normal saline, continue gentle IV fluid hydration, -Monitoring BUN/creatinine closely -I's and O -Currently on sodium bicarb, bicarb on admission 20 -Avoiding nephrotoxins   Cholelithiasis without cholecystitis  -  Monitoring closely -CT abdomen pelvis reviewed, general surgery Dr. Arnoldo Morale note reviewed does not recommend cholecystectomy at this time    GERD -Continue Pepcid   Hypertension -Stable, resuming home medication of Norvasc and Coreg With holding HCTZ due to AKI   Hyperlipidemia -Continue statin   Diabetes mellitus type 2 -Tolerating p.o. -Resuming home regimen -Hemoglobin A1c - 8.3      Consultants: GI   Status post EGD 04/04/2021-esophageal  dilatation, ---------------------------------------------------------------------------------------------------------------------------------  Code Status:   Code Status: Full Code   Family Communication: Family member present at bedside, updated The above findings and plan of    Nutritional status:  Nutrition Problem: Inadequate oral intake Etiology: decreased appetite (the past 2 weeks per daughter) Signs/Symptoms: per patient/family report Interventions: Glucerna shake  The patient's BMI is: Body mass index is 40.6 kg/m. I agree with the assessment and plan as outlined below:      Discharge Instructions:   Discharge Instructions     Activity as tolerated - No restrictions   Complete by: As directed    Diet - low sodium heart healthy   Complete by: As directed    Discharge instructions   Complete by: As directed    Follow-up with your gastroenterologist and 2-3 weeks... Continue taking Protonix twice a day as needed prescribed Continue soft diet.. Follow-up with PCP and referral to nephrologist to monitor your kidney function closely, avoid any medication including NSAIDs: ibuprofen high-dose aspirin.... Continue moderate hydration   Increase activity slowly   Complete by: As directed         Medication List     STOP taking these medications    benazepril-hydrochlorthiazide 20-25 MG tablet Commonly known as: LOTENSIN HCT   famotidine 40 MG tablet Commonly known as: PEPCID   LINZESS PO   methylPREDNISolone 4 MG Tbpk tablet Commonly known as: MEDROL DOSEPAK   sucralfate 1 GM/10ML suspension Commonly known as: CARAFATE       TAKE these medications    albuterol (2.5 MG/3ML) 0.083% nebulizer solution Commonly known as: PROVENTIL Take 2.5 mg by nebulization every 6 (six) hours as needed for wheezing or shortness of breath. What changed: Another medication with the same name was removed. Continue taking this medication, and follow the directions you see  here.   allopurinol 300 MG tablet Commonly known as: ZYLOPRIM Take 300 mg by mouth daily.   amLODipine 5 MG tablet Commonly known as: NORVASC Take 5 mg by mouth daily.   aspirin EC 81 MG tablet Take 162 mg by mouth daily. Swallow whole.   carvedilol 25 MG tablet Commonly known as: COREG Take 25 mg by mouth 2 (two) times daily with a meal.   cholecalciferol 25 MCG (1000 UNIT) tablet Commonly known as: VITAMIN D Take 1,000 Units by mouth daily.   diclofenac Sodium 1 % Gel Commonly known as: VOLTAREN Apply 2 g topically 4 (four) times daily.   docusate sodium 100 MG capsule Commonly known as: Colace Take 1 capsule (100 mg total) by mouth daily.   ferrous sulfate 325 (65 FE) MG tablet Take 1 tablet (325 mg total) by mouth daily.   Fish Oil 1000 MG Caps Take 1 capsule by mouth 3 (three) times daily.   gemfibrozil 600 MG tablet Commonly known as: LOPID Take 600 mg by mouth 2 (two) times daily before a meal.   Lantus SoloStar 100 UNIT/ML Solostar Pen Generic drug: insulin glargine Inject 40 Units into the skin at bedtime.   latanoprost 0.005 % ophthalmic solution Commonly known  as: XALATAN Place 1 drop into both eyes at bedtime.   NovoLOG FlexPen 100 UNIT/ML FlexPen Generic drug: insulin aspart Inject 25 Units into the skin 3 (three) times daily with meals.   oxyCODONE 5 MG immediate release tablet Commonly known as: Oxy IR/ROXICODONE Take 1 tablet (5 mg total) by mouth every 4 (four) hours as needed for up to 3 days for moderate pain.   pantoprazole 40 MG tablet Commonly known as: PROTONIX Take 1 tablet (40 mg total) by mouth 2 (two) times daily. What changed: when to take this   rosuvastatin 40 MG tablet Commonly known as: CRESTOR Take 40 mg by mouth daily.   sodium bicarbonate 650 MG tablet Take 650 mg by mouth 3 (three) times daily.   Trelegy Ellipta 100-62.5-25 MCG/INH Aepb Generic drug: Fluticasone-Umeclidin-Vilant Inhale 1 puff into the lungs daily  as needed (shortness of breath).   vitamin B-12 500 MCG tablet Commonly known as: CYANOCOBALAMIN Take 500 mcg by mouth daily.        Allergies  Allergen Reactions   Penicillins     REACTION: rash/swelling   Sulfa Antibiotics Rash     Procedures /Studies:   CT ABDOMEN PELVIS WO CONTRAST  Result Date: 04/03/2021 CLINICAL DATA:  Flank pain, kidney stone suspected EXAM: CT ABDOMEN AND PELVIS WITHOUT CONTRAST TECHNIQUE: Multidetector CT imaging of the abdomen and pelvis was performed following the standard protocol without IV contrast. COMPARISON:  None. FINDINGS: Lower chest: Lung bases are clear.  Small hiatal hernia. Hepatobiliary: No focal liver abnormality. Gallstones are present. No biliary dilatation. Pancreas: Unremarkable. Spleen: Unremarkable. Adrenals/Urinary Tract: Adrenals are unremarkable. There is no hydronephrosis. No renal calculi. Bladder is unremarkable. Stomach/Bowel: Stomach is within normal limits apart from hiatal hernia. Bowel is normal in caliber. Vascular/Lymphatic: Aortic atherosclerosis. No enlarged lymph nodes. Reproductive: Status post hysterectomy. No adnexal masses. Other: No free fluid.  Abdominal wall is unremarkable. Musculoskeletal: Degenerative changes of the included spine. IMPRESSION: No urinary tract calculi. Cholelithiasis. Small hiatal hernia. Aortic atherosclerosis. Electronically Signed   By: Macy Mis M.D.   On: 04/03/2021 16:31   DG Chest 2 View  Result Date: 04/03/2021 CLINICAL DATA:  Short of breath.  Right rib pain. EXAM: CHEST - 2 VIEW COMPARISON:  Chest CT 03/26/2021 FINDINGS: The heart size and mediastinal contours are within normal limits. Both lungs are clear. The visualized skeletal structures are unremarkable. IMPRESSION: No active cardiopulmonary disease. Electronically Signed   By: Franchot Gallo M.D.   On: 04/03/2021 15:48   NM Pulmonary Perfusion  Result Date: 04/03/2021 CLINICAL DATA:  PE suspected, low/intermediate prob,  positive D-dimer. Chest pain and dyspnea for while. EXAM: NUCLEAR MEDICINE PERFUSION LUNG SCAN TECHNIQUE: Perfusion images were obtained in multiple projections after intravenous injection of radiopharmaceutical. Ventilation scans intentionally deferred if perfusion scan and chest x-ray adequate for interpretation during COVID 19 epidemic. RADIOPHARMACEUTICALS:  4.3 mCi Tc-70m MAA IV COMPARISON:  Chest radiograph from earlier today. FINDINGS: No significant perfusion defects in either lung. IMPRESSION: No scintigraphic evidence of acute pulmonary embolism. Electronically Signed   By: Ilona Sorrel M.D.   On: 04/03/2021 19:16   NM Myocar Multi W/Spect W/Wall Motion / EF  Result Date: 03/07/2021   The study is low risk.   No ST deviation was noted. The ECG was negative for ischemia.   LV perfusion is abnormal. Defect 1: There is a small defect with mild reduction in uptake present in the apical anterior location(s) that is reversible. There is normal wall motion in the  defect area. Consistent with artifact caused by variable breast attenuation. Defect 2: There is a small defect with mild reduction in uptake present in the basal inferolateral location(s) that is fixed. There is normal wall motion in the defect area. Consistent with artifact caused by breast attenuation.   Left ventricular function is normal. Nuclear stress EF: 53 %. Low risk study with evidence of variable breast attenuation artifact, no large ischemic territories and low normal LVEF at 53%.   ECHOCARDIOGRAM COMPLETE  Result Date: 03/07/2021    ECHOCARDIOGRAM REPORT   Patient Name:   Shantice RAY Eggenberger Date of Exam: 03/07/2021 Medical Rec #:  [id number]          Height:       62.0 in Accession #:    [id number]         Weight:       227.6 lb Date of Birth:  09-Jan-1951          BSA:          2.020 m Patient Age:    11 years           BP:           126/64 mmHg Patient Gender: F                  HR:           80 bpm. Exam Location:  [facility]  Procedure: 2D Echo, Cardiac Doppler and Color Doppler Indications:    Dyspnea  History:        Patient has no prior history of Echocardiogram examinations.                 Stroke, Signs/Symptoms:Dyspnea and CKD; Risk                 Factors:Hypertension, Diabetes, Dyslipidemia and Morbid obesity.  Sonographer:    Dustin Flock RDCS Referring Phys: [place]  1. Left ventricular ejection fraction, by estimation, is 60 to 65%. The left ventricle has normal function. The left ventricle has no regional wall motion abnormalities. There is mild left ventricular hypertrophy. Left ventricular diastolic parameters are consistent with Grade I diastolic dysfunction (impaired relaxation).  2. Right ventricular systolic function is normal. The right ventricular size is normal.  3. The mitral valve is grossly normal. Trivial mitral valve regurgitation.  4. The aortic valve is tricuspid. Aortic valve regurgitation is not visualized. No aortic stenosis is present.  5. The inferior vena cava is normal in size with greater than 50% respiratory variability, suggesting right atrial pressure of 3 mmHg. Comparison(s): No prior Echocardiogram. FINDINGS  Left Ventricle: Left ventricular ejection fraction, by estimation, is 60 to 65%. The left ventricle has normal function. The left ventricle has no regional wall motion abnormalities. The left ventricular internal cavity size was normal in size. There is  mild left ventricular hypertrophy. Left ventricular diastolic parameters are consistent with Grade I diastolic dysfunction (impaired relaxation). Right Ventricle: The right ventricular size is normal. No increase in right ventricular wall thickness. Right ventricular systolic function is normal. Left Atrium: Left atrial size was normal in size. Right Atrium: Right atrial size was normal in size. Pericardium: There is no evidence of pericardial effusion. Mitral Valve: The mitral valve is grossly normal. Trivial  mitral valve regurgitation. Tricuspid Valve: The tricuspid valve is grossly normal. Tricuspid valve regurgitation is trivial. Aortic Valve: The aortic valve is tricuspid. There is mild aortic valve annular calcification. Aortic valve regurgitation  is not visualized. No aortic stenosis is present. Pulmonic Valve: The pulmonic valve was not well visualized. Pulmonic valve regurgitation is trivial. Aorta: The aortic root is normal in size and structure. Venous: The inferior vena cava is normal in size with greater than 50% respiratory variability, suggesting right atrial pressure of 3 mmHg. IAS/Shunts: No atrial level shunt detected by color flow Doppler.  LEFT VENTRICLE PLAX 2D LVIDd:         4.10 cm  Diastology LVIDs:         2.50 cm  LV e' medial:    4.46 cm/s LV PW:         1.20 cm  LV E/e' medial:  16.4 LV IVS:        1.10 cm  LV e' lateral:   6.74 cm/s LVOT diam:     2.10 cm  LV E/e' lateral: 10.9 LV SV:         65 LV SV Index:   32 LVOT Area:     3.46 cm  RIGHT VENTRICLE RV Basal diam:  2.80 cm RV S prime:     7.51 cm/s TAPSE (M-mode): 2.1 cm LEFT ATRIUM             Index       RIGHT ATRIUM          Index LA diam:        3.90 cm 1.93 cm/m  RA Area:     9.23 cm LA Vol (A2C):   23.9 ml 11.83 ml/m RA Volume:   17.30 ml 8.57 ml/m LA Vol (A4C):   23.7 ml 11.73 ml/m LA Biplane Vol: 26.1 ml 12.92 ml/m  AORTIC VALVE LVOT Vmax:   80.90 cm/s LVOT Vmean:  53.900 cm/s LVOT VTI:    0.189 m  AORTA Ao Root diam: 2.80 cm MITRAL VALVE MV Area (PHT): 5.02 cm    SHUNTS MV Decel Time: 151 msec    Systemic VTI:  0.19 m MV E velocity: 73.30 cm/s  Systemic Diam: 2.10 cm MV A velocity: 99.80 cm/s MV E/A ratio:  0.73 Rozann Lesches MD Electronically signed by Rozann Lesches MD Signature Date/Time: 03/07/2021/4:24:23 PM    Final     Subjective:   Patient was seen and examined 04/05/2021, 9:57 AM Patient stable today. No acute distress.  No issues overnight Stable for discharge.  Discharge Exam:    Vitals:   04/04/21  2110 04/05/21 0524 04/05/21 0749 04/05/21 0829  BP: (!) 107/92 135/75  (!) 119/50  Pulse: 77 83    Resp: 19 19    Temp: 97.8 F (36.6 C) 98.3 F (36.8 C)    TempSrc: Oral Oral    SpO2: 99% 96% 97%   Weight:      Height:        General: Pt lying comfortably in bed & appears in no obvious distress. Cardiovascular: S1 & S2 heard, RRR, S1/S2 +. No murmurs, rubs, gallops or clicks. No JVD or pedal edema. Respiratory: Clear to auscultation without wheezing, rhonchi or crackles. No increased work of breathing. Abdominal:  Non-distended, non-tender & soft. No organomegaly or masses appreciated. Normal bowel sounds heard. CNS: Alert and oriented. No focal deficits. Extremities: no edema, no cyanosis      The results of significant diagnostics from this hospitalization (including imaging, microbiology, ancillary and laboratory) are listed below for reference.      Microbiology:   Recent Results (from the past 240 hour(s))  Resp Panel by RT-PCR (Flu A&B, Covid)  Nasopharyngeal Swab     Status: None   Collection Time: 04/03/21  8:40 PM   Specimen: Nasopharyngeal Swab; Nasopharyngeal(NP) swabs in vial transport medium  Result Value Ref Range Status   SARS Coronavirus 2 by RT PCR NEGATIVE NEGATIVE Final    Comment: (NOTE) SARS-CoV-2 target nucleic acids are NOT DETECTED.  The SARS-CoV-2 RNA is generally detectable in upper respiratory specimens during the acute phase of infection. The lowest concentration of SARS-CoV-2 viral copies this assay can detect is 138 copies/mL. A negative result does not preclude SARS-Cov-2 infection and should not be used as the sole basis for treatment or other patient management decisions. A negative result may occur with  improper specimen collection/handling, submission of specimen other than nasopharyngeal swab, presence of viral mutation(s) within the areas targeted by this assay, and inadequate number of viral copies(<138 copies/mL). A negative  result must be combined with clinical observations, patient history, and epidemiological information. The expected result is Negative.  Fact Sheet for Patients:  EntrepreneurPulse.com.au  Fact Sheet for Healthcare Providers:  IncredibleEmployment.be  This test is no t yet approved or cleared by the [place] FDA and  has been authorized for detection and/or diagnosis of SARS-CoV-2 by FDA under an Emergency Use Authorization (EUA). This EUA will remain  in effect (meaning this test can be used) for the duration of the COVID-19 declaration under Section 564(b)(1) of the Act, 21 U.S.C.section 360bbb-3(b)(1), unless the authorization is terminated  or revoked sooner.       Influenza A by PCR NEGATIVE NEGATIVE Final   Influenza B by PCR NEGATIVE NEGATIVE Final    Comment: (NOTE) The Xpert Xpress SARS-CoV-2/FLU/RSV plus assay is intended as an aid in the diagnosis of influenza from Nasopharyngeal swab specimens and should not be used as a sole basis for treatment. Nasal washings and aspirates are unacceptable for Xpert Xpress SARS-CoV-2/FLU/RSV testing.  Fact Sheet for Patients: EntrepreneurPulse.com.au  Fact Sheet for Healthcare Providers: IncredibleEmployment.be  This test is not yet approved or cleared by the [place] FDA and has been authorized for detection and/or diagnosis of SARS-CoV-2 by FDA under an Emergency Use Authorization (EUA). This EUA will remain in effect (meaning this test can be used) for the duration of the COVID-19 declaration under Section 564(b)(1) of the Act, 21 U.S.C. section 360bbb-3(b)(1), unless the authorization is terminated or revoked.  Performed at [facility], [address] [place], [place] [address]      Labs:   CBC: Recent Labs  Lab 04/03/21 1420 04/04/21 0543 04/05/21 0535  WBC 11.7* 11.3* 11.2*  NEUTROABS  --  6.8  --   HGB 9.9* 9.0* 8.6*  HCT  31.7* 28.9* 27.6*  MCV 93.0 92.6 91.7  PLT 403* 360 343   Basic Metabolic Panel: Recent Labs  Lab 04/03/21 1420 04/04/21 0543 04/05/21 0535  NA 139 138 139  K 4.3 3.9 3.8  CL 110 110 111  CO2 20* 21* 22  GLUCOSE 139* 143* 96  BUN 36* 33* 27*  CREATININE 2.44* 2.23* 2.13*  CALCIUM 9.5 8.9 8.8*  MG  --  2.1  --    Liver Function Tests: Recent Labs  Lab 04/03/21 1420 04/04/21 0543  AST 15 13*  ALT 16 13  ALKPHOS 57 45  BILITOT 0.5 0.2*  PROT 8.1 7.0  ALBUMIN 3.8 3.3*   BNP (last 3 results) Recent Labs    04/03/21 1420  BNP 26.0   Cardiac Enzymes: No results for input(s): CKTOTAL, CKMB, CKMBINDEX, TROPONINI in the  last 168 hours. CBG: Recent Labs  Lab 04/04/21 1310 04/04/21 1400 04/04/21 1631 04/04/21 2111 04/05/21 0807  GLUCAP 114* 112* 150* 122* 109*   Hgb A1c Recent Labs    04/04/21 0543  HGBA1C 8.3*   Lipid Profile No results for input(s): CHOL, HDL, LDLCALC, TRIG, CHOLHDL, LDLDIRECT in the last 72 hours. Thyroid function studies Recent Labs    04/03/21 1420  TSH 1.597   Anemia work up Recent Labs    04/03/21 1420 04/04/21 0759  VITAMINB12  --  1,308*  FOLATE 15.1  --   TIBC  --  295  IRON  --  30   Urinalysis    Component Value Date/Time   COLORURINE YELLOW 04/03/2021 2250   APPEARANCEUR HAZY (A) 04/03/2021 2250   LABSPEC 1.015 04/03/2021 2250   PHURINE 5.0 04/03/2021 2250   GLUCOSEU >=500 (A) 04/03/2021 2250   HGBUR NEGATIVE 04/03/2021 2250   HGBUR negative 10/17/2007 1311   BILIRUBINUR NEGATIVE 04/03/2021 2250   KETONESUR NEGATIVE 04/03/2021 [place] 04/03/2021 2250   UROBILINOGEN negative 10/17/2007 1311   NITRITE NEGATIVE 04/03/2021 2250   [place] 04/03/2021 2250         Time coordinating discharge: Over 45 minutes  SIGNED: Deatra James, MD, FACP, FHM. [facility],  Please use [email or web address] to Page If 7PM-7AM, please contact night-coverage [email or web address]  [facility] 04/05/2021, 9:57 AM

## 2021-04-07 ENCOUNTER — Encounter (HOSPITAL_COMMUNITY): Payer: Self-pay | Admitting: Gastroenterology

## 2021-04-08 DIAGNOSIS — I700 Atherosclerosis of aorta: Secondary | ICD-10-CM | POA: Diagnosis not present

## 2021-04-08 DIAGNOSIS — I10 Essential (primary) hypertension: Secondary | ICD-10-CM | POA: Diagnosis not present

## 2021-04-08 DIAGNOSIS — Z6838 Body mass index (BMI) 38.0-38.9, adult: Secondary | ICD-10-CM | POA: Diagnosis not present

## 2021-04-08 DIAGNOSIS — K219 Gastro-esophageal reflux disease without esophagitis: Secondary | ICD-10-CM | POA: Diagnosis not present

## 2021-04-08 DIAGNOSIS — N184 Chronic kidney disease, stage 4 (severe): Secondary | ICD-10-CM | POA: Diagnosis not present

## 2021-04-08 LAB — SURGICAL PATHOLOGY

## 2021-04-09 ENCOUNTER — Other Ambulatory Visit: Payer: Self-pay | Admitting: *Deleted

## 2021-04-09 NOTE — Patient Outreach (Signed)
[facility]) Care Management  04/09/2021  Katherine Stokes 10-02-1950 [id number]    Telephone outreach PAC for Katherine Stokes.  Spoke with Katherine Stokes, who verified her identity. NP explained Stokes and Katherine Stokes would like written materials before agreeing to participate. Will send a letter and brochure and call again in 2 weeks.  Katherine Stokes. Katherine Neither, MSN, [facility] Gerontological Nurse Practitioner [facility] Care Management [phone]

## 2021-04-10 DIAGNOSIS — H401121 Primary open-angle glaucoma, left eye, mild stage: Secondary | ICD-10-CM | POA: Diagnosis not present

## 2021-04-18 ENCOUNTER — Ambulatory Visit: Payer: Medicare Other | Admitting: Gastroenterology

## 2021-04-21 DIAGNOSIS — E1122 Type 2 diabetes mellitus with diabetic chronic kidney disease: Secondary | ICD-10-CM | POA: Diagnosis not present

## 2021-04-21 DIAGNOSIS — N184 Chronic kidney disease, stage 4 (severe): Secondary | ICD-10-CM | POA: Diagnosis not present

## 2021-04-21 DIAGNOSIS — N390 Urinary tract infection, site not specified: Secondary | ICD-10-CM | POA: Diagnosis not present

## 2021-04-21 DIAGNOSIS — D631 Anemia in chronic kidney disease: Secondary | ICD-10-CM | POA: Diagnosis not present

## 2021-04-24 ENCOUNTER — Other Ambulatory Visit: Payer: Self-pay | Admitting: *Deleted

## 2021-04-24 NOTE — Patient Outreach (Signed)
[facility]) Care Management  04/24/2021  Katherine Stokes Nov 03, 1950 [id number]  Telephone outreach to follow up on invitation for care management services.  [name] says she received the materials and is not interested in participating at this time. Advised pt that it is a voluntary program and that she should keep the information for a future need, she can call me.  Eulah Pont. Myrtie Neither, MSN, [facility] Gerontological Nurse Practitioner [facility] Care Management [phone]

## 2021-05-06 ENCOUNTER — Other Ambulatory Visit: Payer: Self-pay

## 2021-05-06 ENCOUNTER — Encounter: Payer: Self-pay | Admitting: Pulmonary Disease

## 2021-05-06 ENCOUNTER — Ambulatory Visit (INDEPENDENT_AMBULATORY_CARE_PROVIDER_SITE_OTHER): Payer: Medicare Other | Admitting: Pulmonary Disease

## 2021-05-06 VITALS — BP 130/78 | HR 78 | Temp 98.4°F | Ht 63.0 in | Wt 220.0 lb

## 2021-05-06 DIAGNOSIS — J4540 Moderate persistent asthma, uncomplicated: Secondary | ICD-10-CM | POA: Diagnosis not present

## 2021-05-06 MED ORDER — ALBUTEROL SULFATE HFA 108 (90 BASE) MCG/ACT IN AERS: 2.0000 | INHALATION_SPRAY | Freq: Four times a day (QID) | RESPIRATORY_TRACT | 5 refills | Status: AC | PRN

## 2021-05-06 NOTE — Patient Instructions (Signed)
Will arrange for pulmonary function test at [facility]  Trelegy one puff daily, and rinse your mouth after each use  Albuterol every 6 hours as needed for cough, wheeze, or chest congestion  Follow up in 4 months

## 2021-05-06 NOTE — Progress Notes (Signed)
[facility]  Chief Complaint  Patient presents with   Consult    Asthma consult. Some SOB with exertion and wheezing.     Past Surgical History:  She  has a past surgical history that includes Tonsillectomy; Abdominal hysterectomy; Esophagogastroduodenoscopy (egd) with propofol (N/A, 04/04/2021); Esophageal dilation (N/A, 04/04/2021); and polypectomy (04/04/2021).  Past Medical History:  CKD 2, HTN, GERD, CVA, HLD, DM type 2  Constitutional:  BP 130/78   Pulse 78   Temp 98.4 F (36.9 C)   Ht 5\' 3"  (1.6 m)   Wt 220 lb 0.6 oz (99.8 kg)   SpO2 98% Comment: ra  BMI 38.98 kg/m   Brief Summary:  Katherine Stokes is a 70 y.o. female with asthma.      Subjective:   She is here with her daughter.  She was diagnosed with asthma in her 64's after getting sick.  She would get cough and wheeze.  Sometimes would bring up sputum.  Has mild seasonal allergies.  No food allergies.  Gets a rash from PCN.  Never smoked cigarettes.  No occupational exposures.  No history of pneumonia of TB.  She has been using trelegy and this has helped.  Not having as much cough or wheeze.  Sleeping okay.  Keeps up with activity without too much difficulty.  She was in hospital in abdominal pain.  She had dilation of esophagus.  She feels her breathing has improved since she was in hospital.   CXR and V/Q scan from 04/03/21 were normal.   Physical Exam:   Appearance - well kempt   ENMT - no sinus tenderness, no oral exudate, no LAN, Mallampati 4 airway, no stridor  Respiratory - equal breath sounds bilaterally, no wheezing or rales  CV - s1s2 regular rate and rhythm, no murmurs  Ext - no clubbing, no edema  Skin - no rashes  Psych - normal mood and affect   Pulmonary testing:    Chest Imaging:  CT chest 03/26/21 >> atherosclerosis, gallstones, small hiatal hernia  Sleep Tests:  PSG 12/03/10 >> AHI 3, SpO2 low 89%  Cardiac Tests:  Echo  03/07/21 >> EF 60 to 65%, mild LVH, grade 1 DD  Social History:  She  reports that she has never smoked. She has never used smokeless tobacco. She reports that she does not drink alcohol and does not use drugs.  Family History:  Her family history includes Colon cancer in her father; Dementia in her mother.     Assessment/Plan:   Moderate persistent asthma. - symptoms stable at present - continue trelegy and prn albuterol - reviewed difference's between her inhalers - will arrange for pulmonary function test  Coronary atherosclerosis. - followed by Dr. Johnny Bridge with [facility]   Time Spent Involved in Patient Care on Day of Examination:  34 minutes  Follow up:   Patient Instructions  Will arrange for pulmonary function test at [facility]  Trelegy one puff daily, and rinse your mouth after each use  Albuterol every 6 hours as needed for cough, wheeze, or chest congestion  Follow up in 4 months  Medication List:   Allergies as of 05/06/2021       Reactions   Penicillins    REACTION: rash/swelling   Sulfa Antibiotics Rash        Medication List        Accurate as of May 06, 2021  9:55 AM. If you have any  questions, ask your nurse or doctor.          STOP taking these medications    sodium bicarbonate 650 MG tablet Stopped by: Chesley Mires, MD       TAKE these medications    albuterol (2.5 MG/3ML) 0.083% nebulizer solution Commonly known as: PROVENTIL Take 2.5 mg by nebulization every 6 (six) hours as needed for wheezing or shortness of breath. What changed: Another medication with the same name was added. Make sure you understand how and when to take each. Changed by: Chesley Mires, MD   albuterol 108 (90 Base) MCG/ACT inhaler Commonly known as: VENTOLIN HFA Inhale 2 puffs into the lungs every 6 (six) hours as needed for wheezing or shortness of breath. What changed: You were already taking a medication with the same name,  and this prescription was added. Make sure you understand how and when to take each. Changed by: Chesley Mires, MD   allopurinol 300 MG tablet Commonly known as: ZYLOPRIM Take 300 mg by mouth daily.   amLODipine 5 MG tablet Commonly known as: NORVASC Take 5 mg by mouth daily.   aspirin EC 81 MG tablet Take 162 mg by mouth daily. Swallow whole.   carvedilol 25 MG tablet Commonly known as: COREG Take 25 mg by mouth 2 (two) times daily with a meal.   cholecalciferol 25 MCG (1000 UNIT) tablet Commonly known as: VITAMIN D Take 1,000 Units by mouth daily.   diclofenac Sodium 1 % Gel Commonly known as: VOLTAREN Apply 2 g topically 4 (four) times daily.   docusate sodium 100 MG capsule Commonly known as: Colace Take 1 capsule (100 mg total) by mouth daily.   ferrous sulfate 325 (65 FE) MG tablet Take 1 tablet (325 mg total) by mouth daily.   Fish Oil 1000 MG Caps Take 1 capsule by mouth 3 (three) times daily.   gemfibrozil 600 MG tablet Commonly known as: LOPID Take 600 mg by mouth 2 (two) times daily before a meal.   Lantus SoloStar 100 UNIT/ML Solostar Pen Generic drug: insulin glargine Inject 40 Units into the skin at bedtime.   latanoprost 0.005 % ophthalmic solution Commonly known as: XALATAN Place 1 drop into both eyes at bedtime.   NovoLOG FlexPen 100 UNIT/ML FlexPen Generic drug: insulin aspart Inject 25 Units into the skin 3 (three) times daily with meals.   pantoprazole 40 MG tablet Commonly known as: PROTONIX Take 1 tablet (40 mg total) by mouth 2 (two) times daily.   rosuvastatin 40 MG tablet Commonly known as: CRESTOR Take 40 mg by mouth daily.   Trelegy Ellipta 100-62.5-25 MCG/ACT Aepb Generic drug: Fluticasone-Umeclidin-Vilant Inhale 1 puff into the lungs daily as needed (shortness of breath).   vitamin B-12 500 MCG tablet Commonly known as: CYANOCOBALAMIN Take 500 mcg by mouth daily.        Signature:  Chesley Mires, MD [place] Pager - [phone] 05/06/2021, 9:55 AM

## 2021-05-20 ENCOUNTER — Encounter (INDEPENDENT_AMBULATORY_CARE_PROVIDER_SITE_OTHER): Payer: Self-pay | Admitting: Gastroenterology

## 2021-05-20 ENCOUNTER — Ambulatory Visit (INDEPENDENT_AMBULATORY_CARE_PROVIDER_SITE_OTHER): Payer: Medicare Other | Admitting: Gastroenterology

## 2021-05-20 ENCOUNTER — Telehealth (INDEPENDENT_AMBULATORY_CARE_PROVIDER_SITE_OTHER): Payer: Self-pay | Admitting: Gastroenterology

## 2021-05-20 ENCOUNTER — Other Ambulatory Visit: Payer: Self-pay

## 2021-05-20 VITALS — BP 99/53 | HR 80 | Temp 97.1°F | Ht 63.0 in | Wt 217.8 lb

## 2021-05-20 DIAGNOSIS — R1319 Other dysphagia: Secondary | ICD-10-CM | POA: Diagnosis not present

## 2021-05-20 DIAGNOSIS — D509 Iron deficiency anemia, unspecified: Secondary | ICD-10-CM

## 2021-05-20 LAB — CBC
HCT: 34.1 % — ABNORMAL LOW (ref 35.0–45.0)
Hemoglobin: 10.9 g/dL — ABNORMAL LOW (ref 11.7–15.5)
MCH: 27.6 pg (ref 27.0–33.0)
MCHC: 32 g/dL (ref 32.0–36.0)
MCV: 86.3 fL (ref 80.0–100.0)
MPV: 11.5 fL (ref 7.5–12.5)
Platelets: 404 10*3/uL — ABNORMAL HIGH (ref 140–400)
RBC: 3.95 10*6/uL (ref 3.80–5.10)
RDW: 14 % (ref 11.0–15.0)
WBC: 10.5 10*3/uL (ref 3.8–10.8)

## 2021-05-20 MED ORDER — PANTOPRAZOLE SODIUM 40 MG PO TBEC: 40.0000 mg | DELAYED_RELEASE_TABLET | Freq: Two times a day (BID) | ORAL | 0 refills | Status: DC

## 2021-05-20 NOTE — Telephone Encounter (Signed)
Patient had new patient appt at [facility] in January 2023 with Aliene Altes, PA. Patient has established with our clinic already. Requested that [place] appt be cancelled.

## 2021-05-20 NOTE — Progress Notes (Signed)
Referring Provider: Neale Burly, MD Primary Care Physician:  Neale Burly, MD Primary GI Physician: Previously unassigned, Dr. Jenetta Downer  Chief Complaint  Patient presents with   Follow-up    Patient states the dysphagia has subsided at this time, denies any nausea vomiting or diarrhea, she states that the chest pain has subsided at this time as well   HPI:   Katherine Stokes is a 70 y.o. female with past medical history of asthma, CKD, HTN, GERD, CVA, HLD, DM.   Patient presenting today for hospital follow up.   Patient had hospital admission 04/04/21 for multiple complaints. Patient c/o R chest wall pain that radiated to axilla and shoulder blade, chest CT at [facility] prior to this admission revealed gallstones, patient was evaluated by Dr. Arnoldo Morale with general surgery during admission in October who felt that symptoms were not related to gallstones. Patient also had anemia during admission with hgb as low as 8.6 (04/05/21), folate level WNL, iron sats 10%, with reported melena 1 week prior to admission. She also Reported dysphagia x several months with no improvement on once daily PPI therapy and carafate. Reported pills and solid foods sticking in epigastric region.   EGD during admission revealed esophageal stenosis that was dilated. She was started on ferrous sulfate 375mg  daily and advised to continue PPI BID x4 weeks. It was also recommended that repeat hemoglobin be checked at follow up, if anemia still present patient would need to undergo colonoscopy.   Patient reports that she is doing well, she has had no more issues with dysphagia and chest pain has resolved since starting PPI BID. She has had no episodes of reflux symptoms. She denies abdominal pain, nausea or vomiting.  Her appetite is good, she has been trying to watch her diet a little more closely to attempt to lose some weight but denies any unintentional weight loss. Her bowels are moving regularly without  constipation or diarrhea.   She denies fatigue, dizziness or shortness of breath. There have been no episodes of melena or rectal bleeding since her discharge from the hospital. She is still taking her iron pills once daily without issue.   Last Colonoscopy:about 8 years ago, normal per patient. Last Endoscopy:04/04/21- 2 cm hiatal hernia. - Benign-appearing esophageal stenosis. Dilated. - Two gastric polyps. Resected and retrieved. Clip was placed. - Normal examined duodenum.  Past Medical History:  Diagnosis Date   Asthma    Chronic kidney disease (CKD), stage II (mild)    Essential hypertension    GERD (gastroesophageal reflux disease)    History of stroke    Residual right leg weakness   Mixed hyperlipidemia    Type 2 diabetes mellitus ([place])     Past Surgical History:  Procedure Laterality Date   ABDOMINAL HYSTERECTOMY     ESOPHAGEAL DILATION N/A 04/04/2021   Procedure: ESOPHAGEAL DILATION;  Surgeon: Harvel Quale, MD;  Location: [facility];  Service: Gastroenterology;  Laterality: N/A;   ESOPHAGOGASTRODUODENOSCOPY (EGD) WITH PROPOFOL N/A 04/04/2021   Procedure: ESOPHAGOGASTRODUODENOSCOPY (EGD) WITH PROPOFOL;  Surgeon: Harvel Quale, MD;  Location: [facility];  Service: Gastroenterology;  Laterality: N/A;   POLYPECTOMY  04/04/2021   Procedure: POLYPECTOMY;  Surgeon: Montez Morita, Quillian Quince, MD;  Location: [facility];  Service: Gastroenterology;;   TONSILLECTOMY      Current Outpatient Medications  Medication Sig Dispense Refill   albuterol (PROVENTIL) (2.5 MG/3ML) 0.083% nebulizer solution Take 2.5 mg by nebulization every 6 (six) hours as needed  for wheezing or shortness of breath.     albuterol (VENTOLIN HFA) 108 (90 Base) MCG/ACT inhaler Inhale 2 puffs into the lungs every 6 (six) hours as needed for wheezing or shortness of breath. 8 g 5   allopurinol (ZYLOPRIM) 300 MG tablet Take 300 mg by mouth daily.     amLODipine (NORVASC) 5 MG  tablet Take 5 mg by mouth daily.     aspirin EC 81 MG tablet Take 162 mg by mouth daily. Swallow whole.     carvedilol (COREG) 25 MG tablet Take 25 mg by mouth 2 (two) times daily with a meal.     cholecalciferol (VITAMIN D) 25 MCG (1000 UNIT) tablet Take 1,000 Units by mouth daily.     diclofenac Sodium (VOLTAREN) 1 % GEL Apply 2 g topically 4 (four) times daily.     docusate sodium (COLACE) 100 MG capsule Take 1 capsule (100 mg total) by mouth daily. 60 capsule 2   ferrous sulfate 325 (65 FE) MG tablet Take 1 tablet (325 mg total) by mouth daily. 30 tablet 3   Fluticasone-Umeclidin-Vilant (TRELEGY ELLIPTA) 100-62.5-25 MCG/INH AEPB Inhale 1 puff into the lungs daily as needed (shortness of breath).     gemfibrozil (LOPID) 600 MG tablet Take 600 mg by mouth 2 (two) times daily before a meal.     insulin aspart (NOVOLOG FLEXPEN) 100 UNIT/ML FlexPen Inject 25 Units into the skin 3 (three) times daily with meals.     insulin glargine (LANTUS SOLOSTAR) 100 UNIT/ML Solostar Pen Inject 40 Units into the skin at bedtime.     latanoprost (XALATAN) 0.005 % ophthalmic solution Place 1 drop into both eyes at bedtime.     Omega-3 Fatty Acids (FISH OIL) 1000 MG CAPS Take 1 capsule by mouth 3 (three) times daily.     pantoprazole (PROTONIX) 40 MG tablet Take 1 tablet (40 mg total) by mouth 2 (two) times daily. 60 tablet 0   rosuvastatin (CRESTOR) 40 MG tablet Take 40 mg by mouth daily.     vitamin B-12 (CYANOCOBALAMIN) 500 MCG tablet Take 500 mcg by mouth daily.     No current facility-administered medications for this visit.    Allergies as of 05/20/2021 - Review Complete 05/20/2021  Allergen Reaction Noted   Penicillins     Sulfa antibiotics Rash     Family History  Problem Relation Age of Onset   Dementia Mother    Colon cancer Father        age greater than 68    Social History   Socioeconomic History   Marital status: Divorced    Spouse name: Not on file   Number of children: Not on file    Years of education: Not on file   Highest education level: Not on file  Occupational History   Not on file  Tobacco Use   Smoking status: Never   Smokeless tobacco: Never  Substance and Sexual Activity   Alcohol use: Never   Drug use: Never   Sexual activity: Not on file  Other Topics Concern   Not on file  Social History Narrative   Not on file   Social Determinants of Health   Financial Resource Strain: Not on file  Food Insecurity: Not on file  Transportation Needs: Not on file  Physical Activity: Not on file  Stress: Not on file  Social Connections: Not on file   Review of systems General: negative for malaise, night sweats, fever, chills, weight loss Neck: Negative for lumps,  goiter, pain and significant neck swelling Resp: Negative for cough, wheezing, dyspnea at rest CV: Negative for chest pain, leg swelling, palpitations, orthopnea GI: denies melena, hematochezia, nausea, vomiting, diarrhea, constipation, dysphagia, odyonophagia, early satiety or unintentional weight loss.  MSK: Negative for joint pain or swelling, back pain, and muscle pain. Derm: Negative for itching or rash Psych: Denies depression, anxiety, memory loss, confusion. No homicidal or suicidal ideation.  Heme: Negative for prolonged bleeding, bruising easily, and swollen nodes. Endocrine: Negative for cold or heat intolerance, polyuria, polydipsia and goiter. Neuro: negative for tremor, gait imbalance, syncope and seizures. The remainder of the review of systems is noncontributory.  Physical Exam: BP (!) 99/53 (BP Location: Left Arm, Patient Position: Sitting, Cuff Size: Large)   Pulse 80   Temp (!) 97.1 F (36.2 C) (Oral)   Ht 5\' 3"  (1.6 m)   Wt 217 lb 12.8 oz (98.8 kg)   BMI 38.58 kg/m  General:   Alert and oriented. No distress noted. Pleasant and cooperative.  Head:  Normocephalic and atraumatic. Eyes:  Conjuctiva clear without scleral icterus. Mouth:  Oral mucosa pink and moist. Good  dentition. No lesions. Heart: Normal rate and rhythm, s1 and s2 heart sounds present.  Lungs: Clear lung sounds in all lobes. Respirations equal and unlabored. Abdomen:  +BS, soft, non-tender and non-distended. No rebound or guarding. No HSM or masses noted. Derm: No palmar erythema or jaundice Msk:  Symmetrical without gross deformities. Normal posture. Extremities:  Without edema. Neurologic:  Alert and  oriented x4 Psych:  Alert and cooperative. Normal mood and affect.  Invalid input(s): 6 MONTHS   ASSESSMENT: Katherine Stokes is a 70 y.o. female presenting today for hospital follow up after admission for chest pain with radiation to shoulder blade, dysphagia and anemia in October.  Dysphagia has resolved since EGD with dilation of benign stenosis in October 2022. She is doing well on pantoprazole 40mg  BID. She was previously on once daily PPI without results. We will continue PPI BID dosing at this time.  Chest pain with radiation to shoulder blades was initially thought secondary to cholelithiasis, however, general surgery did not feel this was the cause of patient's pain during admission. She reports that chest pain/scapular pain has resolved since starting PPI BID.   Last hgb was 8.6 04/05/21, patient denies melena, rectal bleeding, sob, fatigue or dizziness. She is maintained on ferrous sulfate 375mg  daily and tolerating this well. Will check CBC today, if anemia is still present we will need to proceed with colonoscopy for further evaluation of anemia. This was discussed with patient during admission and at today's visit. Indications, risks and benefits of procedure discussed in detail with patient. Patient verbalized understanding and is in agreement to proceed with colonoscopy if labs indicate anemia.   PLAN:  Update H&H 2. Continue PPI BID at this time, will reevaluate stepping down to once daily at next OV 3. Proceed with colonoscopy if labs reveal anemia 4. Continue Ferrous  Sulfate 375mg  Daily 5. Patient to make me aware of new GI symptoms   Follow Up: 3 months  Nayomi Tabron L. Alver Sorrow, MSN, APRN, AGNP-C Adult-Gerontology Nurse Practitioner [facility]

## 2021-05-20 NOTE — Patient Instructions (Signed)
Please continue your pantoprazole 40mg  twice a day, take one in the morning 30-45 minutes before breakfast and one 30-45 minutes prior to dinner in the evenings.  We will check your blood counts today, if your labs show that you are still having some anemia (blood loss), we should go ahead and get you scheduled for a colonoscopy. I will be in touch with you once I have the results to discuss this further.  Please continue taking your Iron pills once a day.  Please let me know if you develop any new GI symptoms.   Follow up 3 months

## 2021-05-20 NOTE — H&P (View-Only) (Signed)
Referring Provider: Neale Burly, MD Primary Care Physician:  Neale Burly, MD Primary GI Physician: Previously unassigned, Dr. Jenetta Downer  Chief Complaint  Patient presents with   Follow-up    Patient states the dysphagia has subsided at this time, denies any nausea vomiting or diarrhea, she states that the chest pain has subsided at this time as well   HPI:   Katherine Stokes is a 70 y.o. female with past medical history of asthma, CKD, HTN, GERD, CVA, HLD, DM.   Patient presenting today for hospital follow up.   Patient had hospital admission 04/04/21 for multiple complaints. Patient c/o R chest wall pain that radiated to axilla and shoulder blade, chest CT at [facility] prior to this admission revealed gallstones, patient was evaluated by Dr. Arnoldo Morale with general surgery during admission in October who felt that symptoms were not related to gallstones. Patient also had anemia during admission with hgb as low as 8.6 (04/05/21), folate level WNL, iron sats 10%, with reported melena 1 week prior to admission. She also Reported dysphagia x several months with no improvement on once daily PPI therapy and carafate. Reported pills and solid foods sticking in epigastric region.   EGD during admission revealed esophageal stenosis that was dilated. She was started on ferrous sulfate 375mg  daily and advised to continue PPI BID x4 weeks. It was also recommended that repeat hemoglobin be checked at follow up, if anemia still present patient would need to undergo colonoscopy.   Patient reports that she is doing well, she has had no more issues with dysphagia and chest pain has resolved since starting PPI BID. She has had no episodes of reflux symptoms. She denies abdominal pain, nausea or vomiting.  Her appetite is good, she has been trying to watch her diet a little more closely to attempt to lose some weight but denies any unintentional weight loss. Her bowels are moving regularly without  constipation or diarrhea.   She denies fatigue, dizziness or shortness of breath. There have been no episodes of melena or rectal bleeding since her discharge from the hospital. She is still taking her iron pills once daily without issue.   Last Colonoscopy:about 8 years ago, normal per patient. Last Endoscopy:04/04/21- 2 cm hiatal hernia. - Benign-appearing esophageal stenosis. Dilated. - Two gastric polyps. Resected and retrieved. Clip was placed. - Normal examined duodenum.  Past Medical History:  Diagnosis Date   Asthma    Chronic kidney disease (CKD), stage II (mild)    Essential hypertension    GERD (gastroesophageal reflux disease)    History of stroke    Residual right leg weakness   Mixed hyperlipidemia    Type 2 diabetes mellitus ([place])     Past Surgical History:  Procedure Laterality Date   ABDOMINAL HYSTERECTOMY     ESOPHAGEAL DILATION N/A 04/04/2021   Procedure: ESOPHAGEAL DILATION;  Surgeon: Harvel Quale, MD;  [facility];  Service: Gastroenterology;  Laterality: N/A;   ESOPHAGOGASTRODUODENOSCOPY (EGD) WITH PROPOFOL N/A 04/04/2021   Procedure: ESOPHAGOGASTRODUODENOSCOPY (EGD) WITH PROPOFOL;  Surgeon: Harvel Quale, MD;  Location: [facility];  Service: Gastroenterology;  Laterality: N/A;   POLYPECTOMY  04/04/2021   Procedure: POLYPECTOMY;  Surgeon: Montez Morita, Quillian Quince, MD;  Location: [facility];  Service: Gastroenterology;;   TONSILLECTOMY      Current Outpatient Medications  Medication Sig Dispense Refill   albuterol (PROVENTIL) (2.5 MG/3ML) 0.083% nebulizer solution Take 2.5 mg by nebulization every 6 (six) hours as needed  for wheezing or shortness of breath.     albuterol (VENTOLIN HFA) 108 (90 Base) MCG/ACT inhaler Inhale 2 puffs into the lungs every 6 (six) hours as needed for wheezing or shortness of breath. 8 g 5   allopurinol (ZYLOPRIM) 300 MG tablet Take 300 mg by mouth daily.     amLODipine (NORVASC) 5 MG  tablet Take 5 mg by mouth daily.     aspirin EC 81 MG tablet Take 162 mg by mouth daily. Swallow whole.     carvedilol (COREG) 25 MG tablet Take 25 mg by mouth 2 (two) times daily with a meal.     cholecalciferol (VITAMIN D) 25 MCG (1000 UNIT) tablet Take 1,000 Units by mouth daily.     diclofenac Sodium (VOLTAREN) 1 % GEL Apply 2 g topically 4 (four) times daily.     docusate sodium (COLACE) 100 MG capsule Take 1 capsule (100 mg total) by mouth daily. 60 capsule 2   ferrous sulfate 325 (65 FE) MG tablet Take 1 tablet (325 mg total) by mouth daily. 30 tablet 3   Fluticasone-Umeclidin-Vilant (TRELEGY ELLIPTA) 100-62.5-25 MCG/INH AEPB Inhale 1 puff into the lungs daily as needed (shortness of breath).     gemfibrozil (LOPID) 600 MG tablet Take 600 mg by mouth 2 (two) times daily before a meal.     insulin aspart (NOVOLOG FLEXPEN) 100 UNIT/ML FlexPen Inject 25 Units into the skin 3 (three) times daily with meals.     insulin glargine (LANTUS SOLOSTAR) 100 UNIT/ML Solostar Pen Inject 40 Units into the skin at bedtime.     latanoprost (XALATAN) 0.005 % ophthalmic solution Place 1 drop into both eyes at bedtime.     Omega-3 Fatty Acids (FISH OIL) 1000 MG CAPS Take 1 capsule by mouth 3 (three) times daily.     pantoprazole (PROTONIX) 40 MG tablet Take 1 tablet (40 mg total) by mouth 2 (two) times daily. 60 tablet 0   rosuvastatin (CRESTOR) 40 MG tablet Take 40 mg by mouth daily.     vitamin B-12 (CYANOCOBALAMIN) 500 MCG tablet Take 500 mcg by mouth daily.     No current facility-administered medications for this visit.    Allergies as of 05/20/2021 - Review Complete 05/20/2021  Allergen Reaction Noted   Penicillins     Sulfa antibiotics Rash     Family History  Problem Relation Age of Onset   Dementia Mother    Colon cancer Father        age greater than 77    Social History   Socioeconomic History   Marital status: Divorced    Spouse name: Not on file   Number of children: Not on file    Years of education: Not on file   Highest education level: Not on file  Occupational History   Not on file  Tobacco Use   Smoking status: Never   Smokeless tobacco: Never  Substance and Sexual Activity   Alcohol use: Never   Drug use: Never   Sexual activity: Not on file  Other Topics Concern   Not on file  Social History Narrative   Not on file   Social Determinants of Health   Financial Resource Strain: Not on file  Food Insecurity: Not on file  Transportation Needs: Not on file  Physical Activity: Not on file  Stress: Not on file  Social Connections: Not on file   Review of systems General: negative for malaise, night sweats, fever, chills, weight loss Neck: Negative for lumps,  goiter, pain and significant neck swelling Resp: Negative for cough, wheezing, dyspnea at rest CV: Negative for chest pain, leg swelling, palpitations, orthopnea GI: denies melena, hematochezia, nausea, vomiting, diarrhea, constipation, dysphagia, odyonophagia, early satiety or unintentional weight loss.  MSK: Negative for joint pain or swelling, back pain, and muscle pain. Derm: Negative for itching or rash Psych: Denies depression, anxiety, memory loss, confusion. No homicidal or suicidal ideation.  Heme: Negative for prolonged bleeding, bruising easily, and swollen nodes. Endocrine: Negative for cold or heat intolerance, polyuria, polydipsia and goiter. Neuro: negative for tremor, gait imbalance, syncope and seizures. The remainder of the review of systems is noncontributory.  Physical Exam: BP (!) 99/53 (BP Location: Left Arm, Patient Position: Sitting, Cuff Size: Large)    Pulse 80    Temp (!) 97.1 F (36.2 C) (Oral)    Ht 5\' 3"  (1.6 m)    Wt 217 lb 12.8 oz (98.8 kg)    BMI 38.58 kg/m  General:   Alert and oriented. No distress noted. Pleasant and cooperative.  Head:  Normocephalic and atraumatic. Eyes:  Conjuctiva clear without scleral icterus. Mouth:  Oral mucosa pink and moist. Good  dentition. No lesions. Heart: Normal rate and rhythm, s1 and s2 heart sounds present.  Lungs: Clear lung sounds in all lobes. Respirations equal and unlabored. Abdomen:  +BS, soft, non-tender and non-distended. No rebound or guarding. No HSM or masses noted. Derm: No palmar erythema or jaundice Msk:  Symmetrical without gross deformities. Normal posture. Extremities:  Without edema. Neurologic:  Alert and  oriented x4 Psych:  Alert and cooperative. Normal mood and affect.  Invalid input(s): 6 MONTHS   ASSESSMENT: Katherine Stokes is a 70 y.o. female presenting today for hospital follow up after admission for chest pain with radiation to shoulder blade, dysphagia and anemia in October.  Dysphagia has resolved since EGD with dilation of benign stenosis in October 2022. She is doing well on pantoprazole 40mg  BID. She was previously on once daily PPI without results. We will continue PPI BID dosing at this time.  Chest pain with radiation to shoulder blades was initially thought secondary to cholelithiasis, however, general surgery did not feel this was the cause of patient's pain during admission. She reports that chest pain/scapular pain has resolved since starting PPI BID.   Last hgb was 8.6 04/05/21, patient denies melena, rectal bleeding, sob, fatigue or dizziness. She is maintained on ferrous sulfate 375mg  daily and tolerating this well. Will check CBC today, if anemia is still present we will need to proceed with colonoscopy for further evaluation of anemia. This was discussed with patient during admission and at today's visit. Indications, risks and benefits of procedure discussed in detail with patient. Patient verbalized understanding and is in agreement to proceed with colonoscopy if labs indicate anemia.   PLAN:  Update H&H 2. Continue PPI BID at this time, will reevaluate stepping down to once daily at next OV 3. Proceed with colonoscopy if labs reveal anemia 4. Continue Ferrous  Sulfate 375mg  Daily 5. Patient to make me aware of new GI symptoms   Follow Up: 3 months  Briant Angelillo L. Alver Sorrow, MSN, APRN, AGNP-C Adult-Gerontology Nurse Practitioner [facility]

## 2021-05-21 ENCOUNTER — Telehealth (INDEPENDENT_AMBULATORY_CARE_PROVIDER_SITE_OTHER): Payer: Self-pay

## 2021-05-21 ENCOUNTER — Other Ambulatory Visit (INDEPENDENT_AMBULATORY_CARE_PROVIDER_SITE_OTHER): Payer: Self-pay

## 2021-05-21 ENCOUNTER — Encounter (INDEPENDENT_AMBULATORY_CARE_PROVIDER_SITE_OTHER): Payer: Self-pay

## 2021-05-21 DIAGNOSIS — D509 Iron deficiency anemia, unspecified: Secondary | ICD-10-CM

## 2021-05-21 MED ORDER — PEG 3350-KCL-NA BICARB-NACL 420 G PO SOLR: 4000.0000 mL | ORAL | 0 refills | Status: DC

## 2021-05-21 NOTE — Telephone Encounter (Signed)
LeighAnn Early Ord, CMA  

## 2021-05-28 NOTE — Patient Instructions (Signed)
   Your procedure is scheduled on: 06/04/2021  Report to [facility] Entrance at    10:45 AM.  Call this number if you have problems the morning of surgery: [phone]   Remember:              Follow Directions on the letter you received from Your Physician's office regarding the Bowel Prep              No Smoking the day of Procedure :   Take these medicines the morning of surgery with A SIP OF WATER: Amlodipine, Carvedilol, Gabapentin, pantoprazole  Take only 1/2 dose of lantus inulin 20 units the night before procedure  No diabetic medication am of procedure   Do not wear jewelry, make-up or nail polish.    Do not bring valuables to the hospital.  Contacts, dentures or bridgework may not be worn into surgery.  .   Patients discharged the day of surgery will not be allowed to drive home.     Colonoscopy, Adult, Care After This sheet gives you information about how to care for yourself after your procedure. Your health care provider may also give you more specific instructions. If you have problems or questions, contact your health care provider. What can I expect after the procedure? After the procedure, it is common to have: A small amount of blood in your stool for 24 hours after the procedure. Some gas. Mild abdominal cramping or bloating.  Follow these instructions at home: General instructions  For the first 24 hours after the procedure: Do not drive or use machinery. Do not sign important documents. Do not drink alcohol. Do your regular daily activities at a slower pace than normal. Eat soft, easy-to-digest foods. Rest often. Take over-the-counter or prescription medicines only as told by your health care provider. It is up to you to get the results of your procedure. Ask your health care provider, or the department performing the procedure, when your results will be ready. Relieving cramping and bloating Try walking around when you have cramps or feel  bloated. Apply heat to your abdomen as told by your health care provider. Use a heat source that your health care provider recommends, such as a moist heat pack or a heating pad. Place a towel between your skin and the heat source. Leave the heat on for 20-30 minutes. Remove the heat if your skin turns bright red. This is especially important if you are unable to feel pain, heat, or cold. You may have a greater risk of getting burned. Eating and drinking Drink enough fluid to keep your urine clear or pale yellow. Resume your normal diet as instructed by your health care provider. Avoid heavy or fried foods that are hard to digest. Avoid drinking alcohol for as long as instructed by your health care provider. Contact a health care provider if: You have blood in your stool 2-3 days after the procedure. Get help right away if: You have more than a small spotting of blood in your stool. You pass large blood clots in your stool. Your abdomen is swollen. You have nausea or vomiting. You have a fever. You have increasing abdominal pain that is not relieved with medicine. This information is not intended to replace advice given to you by your health care provider. Make sure you discuss any questions you have with your health care provider. Document Released: 01/21/2004 Document Revised: 03/02/2016 Document Reviewed: 08/20/2015 Elsevier Interactive Patient Education  [employer].

## 2021-05-29 ENCOUNTER — Other Ambulatory Visit: Payer: Self-pay

## 2021-05-29 ENCOUNTER — Encounter (HOSPITAL_COMMUNITY): Payer: Self-pay

## 2021-05-30 ENCOUNTER — Encounter (HOSPITAL_COMMUNITY)
Admission: RE | Admit: 2021-05-30 | Discharge: 2021-05-30 | Disposition: A | Discharge status: Home / Self Care | Payer: Medicare Other | Source: Ambulatory Visit | Attending: Gastroenterology | Admitting: Gastroenterology

## 2021-05-30 HISTORY — DX: Cerebral infarction, unspecified: I63.9

## 2021-06-04 ENCOUNTER — Ambulatory Visit (HOSPITAL_COMMUNITY): Payer: Medicare Other | Admitting: Anesthesiology

## 2021-06-04 ENCOUNTER — Other Ambulatory Visit: Payer: Self-pay

## 2021-06-04 ENCOUNTER — Encounter (HOSPITAL_COMMUNITY)
Admission: RE | Disposition: A | Discharge status: Home / Self Care | Payer: Self-pay | Source: Home / Self Care | Attending: Gastroenterology

## 2021-06-04 ENCOUNTER — Encounter (HOSPITAL_COMMUNITY): Payer: Self-pay | Admitting: Gastroenterology

## 2021-06-04 ENCOUNTER — Ambulatory Visit (HOSPITAL_COMMUNITY)
Admission: RE | Admit: 2021-06-04 | Discharge: 2021-06-04 | Disposition: A | Discharge status: Home / Self Care | Payer: Medicare Other | Attending: Gastroenterology | Admitting: Gastroenterology

## 2021-06-04 DIAGNOSIS — E782 Mixed hyperlipidemia: Secondary | ICD-10-CM | POA: Diagnosis not present

## 2021-06-04 DIAGNOSIS — N182 Chronic kidney disease, stage 2 (mild): Secondary | ICD-10-CM | POA: Diagnosis not present

## 2021-06-04 DIAGNOSIS — E1122 Type 2 diabetes mellitus with diabetic chronic kidney disease: Secondary | ICD-10-CM | POA: Insufficient documentation

## 2021-06-04 DIAGNOSIS — D120 Benign neoplasm of cecum: Secondary | ICD-10-CM

## 2021-06-04 DIAGNOSIS — D122 Benign neoplasm of ascending colon: Secondary | ICD-10-CM | POA: Insufficient documentation

## 2021-06-04 DIAGNOSIS — K644 Residual hemorrhoidal skin tags: Secondary | ICD-10-CM | POA: Insufficient documentation

## 2021-06-04 DIAGNOSIS — K635 Polyp of colon: Secondary | ICD-10-CM | POA: Diagnosis not present

## 2021-06-04 DIAGNOSIS — K648 Other hemorrhoids: Secondary | ICD-10-CM

## 2021-06-04 DIAGNOSIS — I129 Hypertensive chronic kidney disease with stage 1 through stage 4 chronic kidney disease, or unspecified chronic kidney disease: Secondary | ICD-10-CM | POA: Insufficient documentation

## 2021-06-04 DIAGNOSIS — D509 Iron deficiency anemia, unspecified: Secondary | ICD-10-CM | POA: Diagnosis not present

## 2021-06-04 HISTORY — PX: COLONOSCOPY WITH PROPOFOL: SHX5780

## 2021-06-04 HISTORY — PX: POLYPECTOMY: SHX5525

## 2021-06-04 LAB — GLUCOSE, CAPILLARY: Glucose-Capillary: 125 mg/dL — ABNORMAL HIGH (ref 70–99)

## 2021-06-04 LAB — HM COLONOSCOPY

## 2021-06-04 SURGERY — COLONOSCOPY WITH PROPOFOL
Anesthesia: General

## 2021-06-04 MED ORDER — LACTATED RINGERS IV SOLN: INTRAVENOUS | Status: DC

## 2021-06-04 MED ORDER — PROPOFOL 10 MG/ML IV BOLUS
INTRAVENOUS | Status: DC | PRN
  Administered 2021-06-04: 60 mg via INTRAVENOUS

## 2021-06-04 MED ORDER — PROPOFOL 500 MG/50ML IV EMUL
INTRAVENOUS | Status: DC | PRN
  Administered 2021-06-04: 150 ug/kg/min via INTRAVENOUS

## 2021-06-04 MED ORDER — PHENYLEPHRINE HCL (PRESSORS) 10 MG/ML IV SOLN
INTRAVENOUS | Status: DC | PRN
  Administered 2021-06-04 (×5): 80 ug via INTRAVENOUS

## 2021-06-04 NOTE — Anesthesia Postprocedure Evaluation (Signed)
Anesthesia Post Note  Patient: Katherine Stokes  Procedure(s) Performed: COLONOSCOPY WITH PROPOFOL POLYPECTOMY  Patient location during evaluation: Phase II Anesthesia Type: General Level of consciousness: awake and alert and oriented Pain management: pain level controlled Vital Signs Assessment: post-procedure vital signs reviewed and stable Respiratory status: spontaneous breathing, nonlabored ventilation and respiratory function stable Cardiovascular status: blood pressure returned to baseline and stable Postop Assessment: no apparent nausea or vomiting Anesthetic complications: no   No notable events documented.   Last Vitals:  Vitals:   06/04/21 1237  BP: 102/61  Pulse: 79  Resp: 20  Temp: (!) 36.3 C  SpO2: 98%    Last Pain:  Vitals:   06/04/21 1237  TempSrc: Oral  PainSc: 0-No pain                 Brick Ketcher C Zeppelin Commisso

## 2021-06-04 NOTE — Discharge Instructions (Addendum)
You are being discharged to home.  Resume your previous diet.  We are waiting for your pathology results.  Your physician has recommended a repeat colonoscopy for surveillance based on pathology results.  .Return to GI clinic in 4 months - will recheck iron stores and CBC at that time Continue oral iron

## 2021-06-04 NOTE — Anesthesia Preprocedure Evaluation (Signed)
Anesthesia Evaluation  Patient identified by MRN, date of birth, ID band Patient awake    Reviewed: Allergy & Precautions, H&P , NPO status , Patient's Chart, lab work & pertinent test results, reviewed documented beta blocker date and time   History of Anesthesia Complications Negative for: history of anesthetic complications  Airway Mallampati: III  TM Distance: >3 FB Neck ROM: Full    Dental  (+) Dental Advisory Given, Loose, Poor Dentition,    Pulmonary asthma ,    Pulmonary exam normal breath sounds clear to auscultation       Cardiovascular Exercise Tolerance: Good hypertension, Pt. on medications and Pt. on home beta blockers Normal cardiovascular exam Rhythm:Regular Rate:Normal  1. Left ventricular ejection fraction, by estimation, is 60 to 65%. The left ventricle has normal function. The left ventricle has no regional wall motion abnormalities. There is mild left ventricular hypertrophy.  Left ventricular diastolic parameters  are consistent with Grade I diastolic dysfunction (impaired relaxation).  2. Right ventricular systolic function is normal. The right ventricular size is normal.  3. The mitral valve is grossly normal. Trivial mitral valve  regurgitation.  4. The aortic valve is tricuspid. Aortic valve regurgitation is not visualized. No aortic stenosis is present.  5. The inferior vena cava is normal in size with greater than 50% respiratory variability, suggesting right atrial pressure of 3 mmHg   Neuro/Psych CVA, Residual Symptoms negative psych ROS   GI/Hepatic Neg liver ROS, GERD  Medicated,  Endo/Other  diabetes, Well Controlled, Type 2, Insulin Dependent  Renal/GU Renal InsufficiencyRenal disease (AKI)  negative genitourinary   Musculoskeletal negative musculoskeletal ROS (+)   Abdominal   Peds negative pediatric ROS (+)  Hematology  (+) Blood dyscrasia, anemia ,   Anesthesia Other  Findings 03-Apr-2021 16:53:10 [facility] ROUTINE RECORD Jul 25, 1950 (56 yr) Female Black Test DIX:VEZBM Pain Vent. rate 72 BPM PR interval 200 ms QRS duration 74 ms QT/QTcB 394/431 ms P-R-T axes 44 18 41 Normal sinus rhythm Normal ECG Confirmed by Nanda Quinton [phone]) on 04/03/2021 4:55:50 PM Results reviewed.  LVEF normal at 60 to 65%, no major valvular abnormalities.  Recent stress test was low risk.  Do not anticipate further cardiac testing unless symptoms worsen   Reproductive/Obstetrics negative OB ROS                            Anesthesia Physical  Anesthesia Plan  ASA: 3  Anesthesia Plan: General   Post-op Pain Management:    Induction: Intravenous  PONV Risk Score and Plan: TIVA  Airway Management Planned: Nasal Cannula and Natural Airway  Additional Equipment:   Intra-op Plan:   Post-operative Plan:   Informed Consent: I have reviewed the patients History and Physical, chart, labs and discussed the procedure including the risks, benefits and alternatives for the proposed anesthesia with the patient or authorized representative who has indicated his/her understanding and acceptance.     Dental advisory given  Plan Discussed with: CRNA and Surgeon  Anesthesia Plan Comments:         Anesthesia Quick Evaluation

## 2021-06-04 NOTE — Interval H&P Note (Signed)
History and Physical Interval Note:  06/04/2021 11:49 AM  Katherine Stokes  has presented today for surgery, with the diagnosis of Iron Deficiency Anemia.  The various methods of treatment have been discussed with the patient and family. After consideration of risks, benefits and other options for treatment, the patient has consented to  Procedure(s) with comments: COLONOSCOPY WITH PROPOFOL (N/A) - 12:15 as a surgical intervention.  The patient's history has been reviewed, patient examined, no change in status, stable for surgery.  I have reviewed the patient's chart and labs.  Questions were answered to the patient's satisfaction.     [name] [place]

## 2021-06-04 NOTE — Transfer of Care (Signed)
Immediate Anesthesia Transfer of Care Note  Patient: Katherine Stokes  Procedure(s) Performed: COLONOSCOPY WITH PROPOFOL POLYPECTOMY  Patient Location: Short Stay  Anesthesia Type:General  Level of Consciousness: drowsy  Airway & Oxygen Therapy: Patient Spontanous Breathing  Post-op Assessment: Report given to RN and Post -op Vital signs reviewed and stable  Post vital signs: Reviewed and stable  Last Vitals:  Vitals Value Taken Time  BP    Temp    Pulse    Resp    SpO2      Last Pain:  Vitals:   06/04/21 1151  PainSc: 0-No pain         Complications: No notable events documented.

## 2021-06-04 NOTE — Op Note (Addendum)
[facility] Patient Name: Katherine Stokes Procedure Date: 06/04/2021 11:42 AM MRN: [id number] Date of Birth: [birth year] Attending MD: [name] ,  CSN: [id number] Age: 70 Admit Type: Outpatient Procedure:                Colonoscopy Indications:              Iron deficiency anemia Providers:                [name], Lurline Del, RN, Kristine L. Risa Grill, Technician Referring MD:              Medicines:                Monitored Anesthesia Care Complications:            No immediate complications. Estimated Blood Loss:     Estimated blood loss: none. Procedure:                Pre-Anesthesia Assessment:                           - Prior to the procedure, a History and Physical                            was performed, and patient medications, allergies                            and sensitivities were reviewed. The patient's                            tolerance of previous anesthesia was reviewed.                           - The risks and benefits of the procedure and the                            sedation options and risks were discussed with the                            patient. All questions were answered and informed                            consent was obtained.                           - ASA Grade Assessment: III - A patient with severe                            systemic disease.                           After obtaining informed consent, the colonoscope                            was passed under direct vision. Throughout the  procedure, the patient's blood pressure, pulse, and                            oxygen saturations were monitored continuously. The                            [id number]-HQ190L ([id number]) scope was introduced through                            the anus and advanced to the the cecum, identified                            by appendiceal orifice and ileocecal valve. The                             colonoscopy was technically difficult and complex                            due to significant looping. Successful completion                            of the procedure was aided by applying abdominal                            pressure. The patient tolerated the procedure well.                            The quality of the bowel preparation was adequate. Scope In: 11:58:10 AM Scope Out: 12:33:28 PM Scope Withdrawal Time: 0 hours 20 minutes 20 seconds  Total Procedure Duration: 0 hours 35 minutes 18 seconds  Findings:      Hemorrhoids were found on perianal exam.      A 4 mm polyp was found in the cecum. The polyp was sessile. The polyp       was removed with a cold snare. Resection and retrieval were complete.      The ascending colon revealed significantly excessive looping. Scope       could be advanced by applying pressure in the transverse colon.      Non-bleeding external internal hemorrhoids were found during       retroflexion. The hemorrhoids were small. Impression:               - Hemorrhoids found on perianal exam.                           - One 4 mm polyp in the cecum, removed with a cold                            snare. Resected and retrieved.                           - There was significant looping of the colon.                           - Non-bleeding external internal hemorrhoids. Moderate Sedation:  Per Anesthesia Care Recommendation:           - Discharge patient to home (ambulatory).                           - Resume previous diet.                           - Await pathology results.                           - Repeat colonoscopy for surveillance based on                            pathology results.                           - Return to GI clinic in 4 months - will recheck                            iron stores and CBC at that time                           -Continue oral iron Procedure Code(s):        --- Professional ---                            [phone], Colonoscopy, flexible; with removal of                            tumor(s), polyp(s), or other lesion(s) by snare                            technique Diagnosis Code(s):        --- Professional ---                           K64.4, Residual hemorrhoidal skin tags                           K64.8, Other hemorrhoids                           K63.5, Polyp of colon                           D50.9, Iron deficiency anemia, unspecified CPT copyright 2019 American Medical Association. All rights reserved. The codes documented in this report are preliminary and upon coder review may  be revised to meet current compliance requirements. [name], MD [name],  06/04/2021 12:42:22 PM This report has been signed electronically. Number of Addenda: 0

## 2021-06-05 LAB — SURGICAL PATHOLOGY

## 2021-06-09 ENCOUNTER — Encounter (INDEPENDENT_AMBULATORY_CARE_PROVIDER_SITE_OTHER): Payer: Self-pay | Admitting: *Deleted

## 2021-06-09 ENCOUNTER — Encounter (HOSPITAL_COMMUNITY): Payer: Self-pay | Admitting: Gastroenterology

## 2021-07-07 ENCOUNTER — Ambulatory Visit: Payer: Medicare Other | Admitting: Gastroenterology

## 2021-07-09 DIAGNOSIS — E1121 Type 2 diabetes mellitus with diabetic nephropathy: Secondary | ICD-10-CM | POA: Diagnosis not present

## 2021-07-09 DIAGNOSIS — N184 Chronic kidney disease, stage 4 (severe): Secondary | ICD-10-CM | POA: Diagnosis not present

## 2021-07-09 DIAGNOSIS — K219 Gastro-esophageal reflux disease without esophagitis: Secondary | ICD-10-CM | POA: Diagnosis not present

## 2021-07-09 DIAGNOSIS — Z1331 Encounter for screening for depression: Secondary | ICD-10-CM | POA: Diagnosis not present

## 2021-07-09 DIAGNOSIS — I700 Atherosclerosis of aorta: Secondary | ICD-10-CM | POA: Diagnosis not present

## 2021-07-09 DIAGNOSIS — I10 Essential (primary) hypertension: Secondary | ICD-10-CM | POA: Diagnosis not present

## 2021-07-09 DIAGNOSIS — Z0000 Encounter for general adult medical examination without abnormal findings: Secondary | ICD-10-CM | POA: Diagnosis not present

## 2021-09-04 ENCOUNTER — Ambulatory Visit (INDEPENDENT_AMBULATORY_CARE_PROVIDER_SITE_OTHER): Payer: Medicare Other | Admitting: Gastroenterology

## 2021-10-15 ENCOUNTER — Ambulatory Visit: Payer: Medicare Other | Admitting: Pulmonary Disease

## 2021-10-17 IMAGING — CT CT ABD-PELV W/O CM
2 of 4 series · 17 of 46 positions shown, 19 images · non-contrast
Comparison: None.

CLINICAL DATA: Flank pain, kidney stone suspected

EXAM:
CT ABDOMEN AND PELVIS WITHOUT CONTRAST
TECHNIQUE: Multidetector CT imaging of the abdomen and pelvis was performed
following the standard protocol without IV contrast.

[Series 2: axial st · axial · 0.77mm/px · z∈[+856,+1252]mm · 14 of 89 slices shown, 16 images]
[im 5/89  soft-tissue]
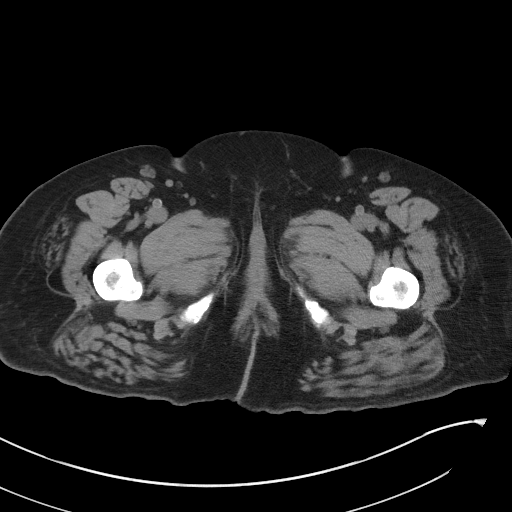
[im 5/89  bone]
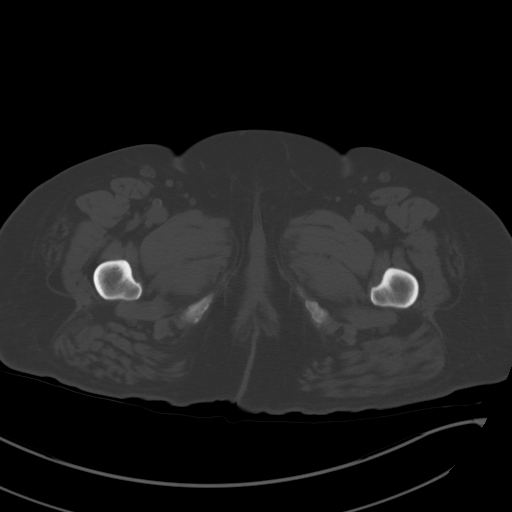
[im 10/89  soft-tissue]
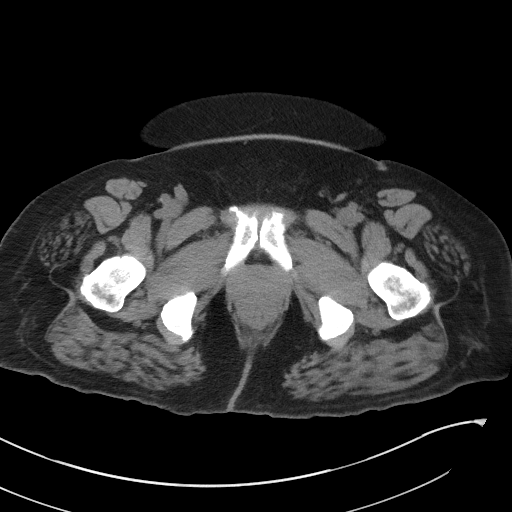
[im 20/89  soft-tissue]
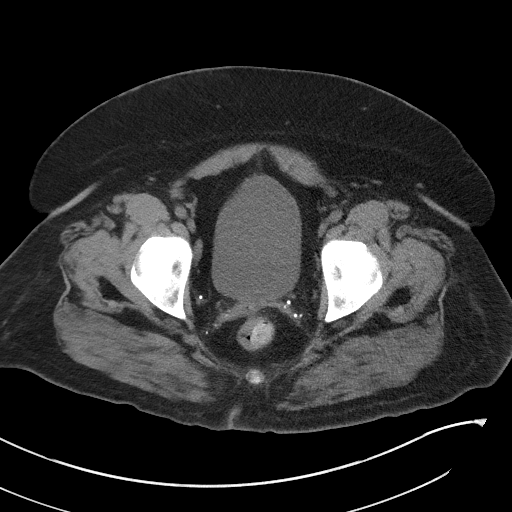
[im 25/89  soft-tissue]
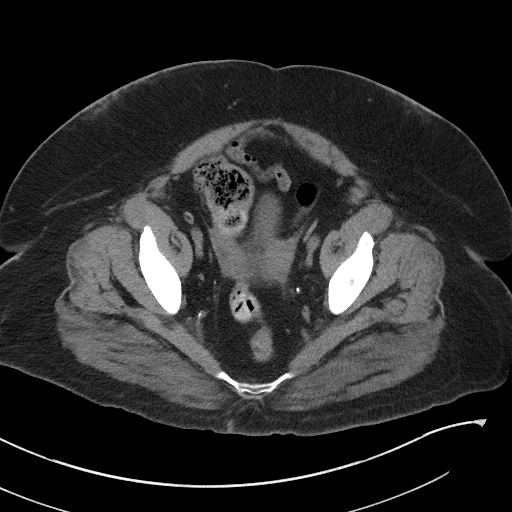
[im 30/89  soft-tissue]
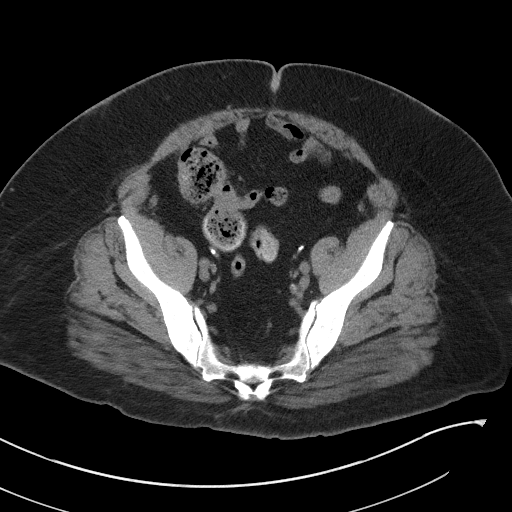
[im 35/89  soft-tissue]
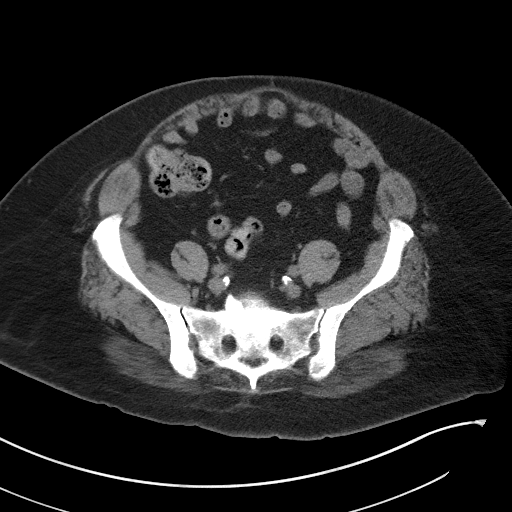
[im 40/89  soft-tissue]
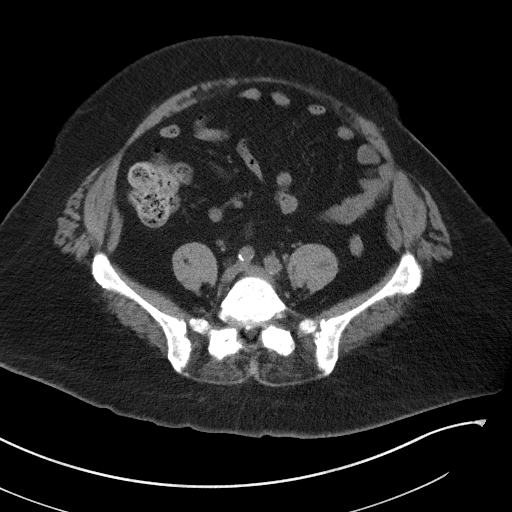
[im 49/89  soft-tissue]
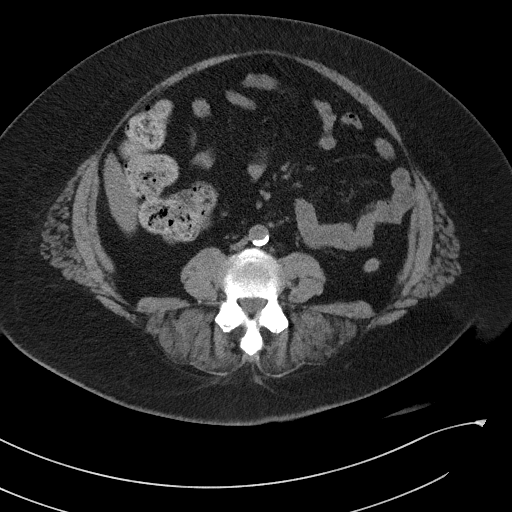
[im 54/89  soft-tissue]
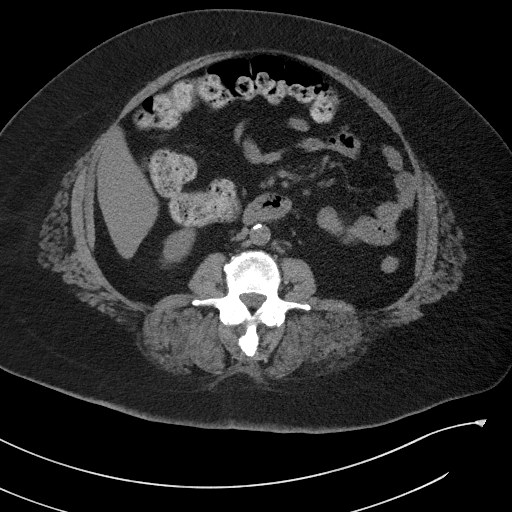
[im 54/89  bone]
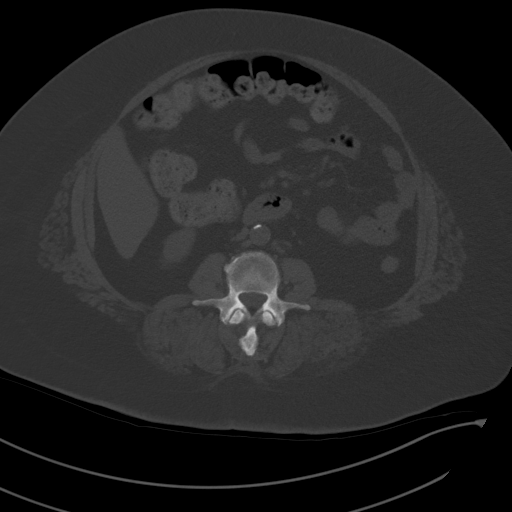
[im 59/89  soft-tissue]
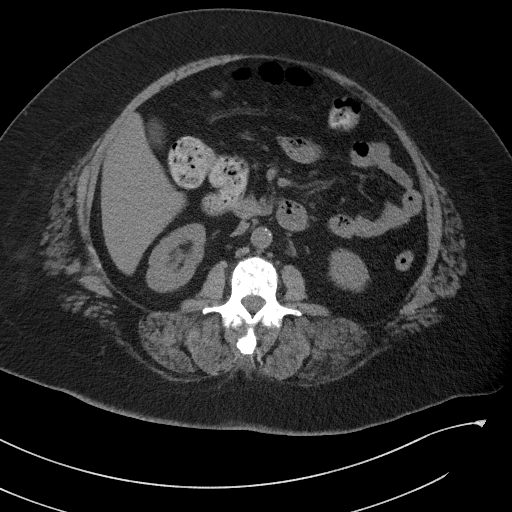
[im 64/89  soft-tissue]
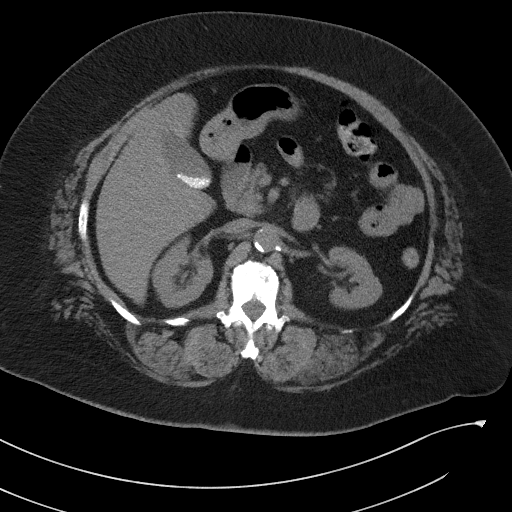
[im 69/89  soft-tissue]
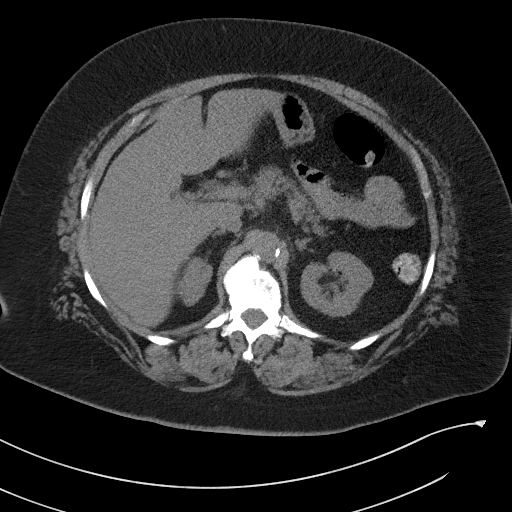
[im 79/89  soft-tissue]
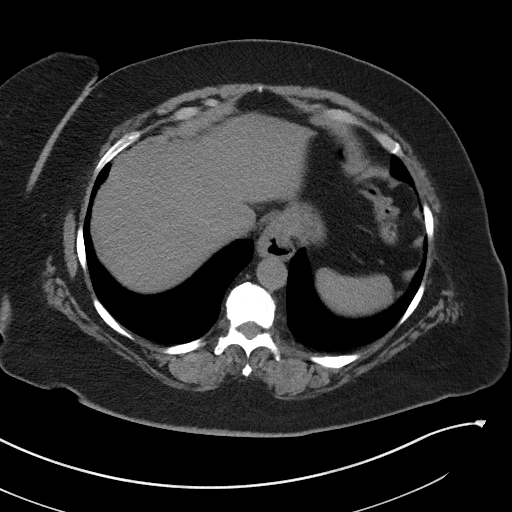
[im 84/89  soft-tissue]
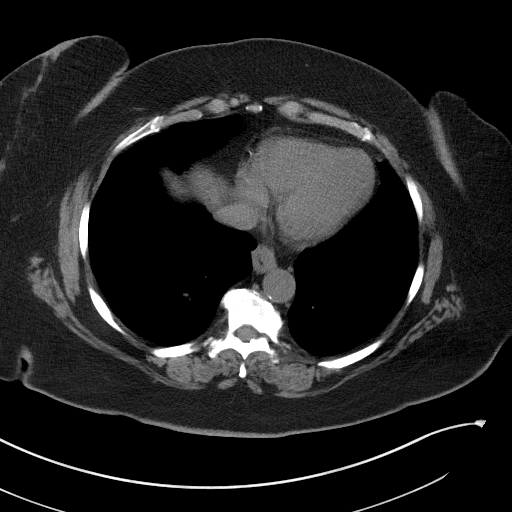

[Series 5: coronal st · coronal · 0.87mm/px · 3 of 118 slices shown]
[im 40/118  soft-tissue]
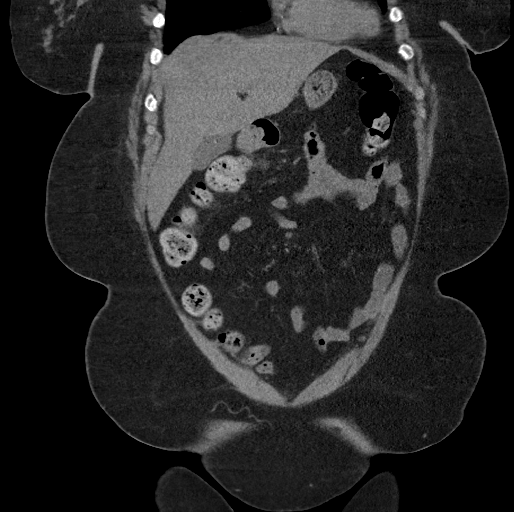
[im 53/118  soft-tissue]
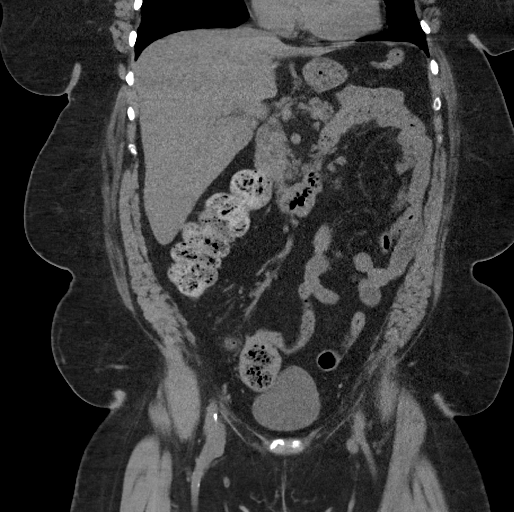
[im 66/118  soft-tissue]
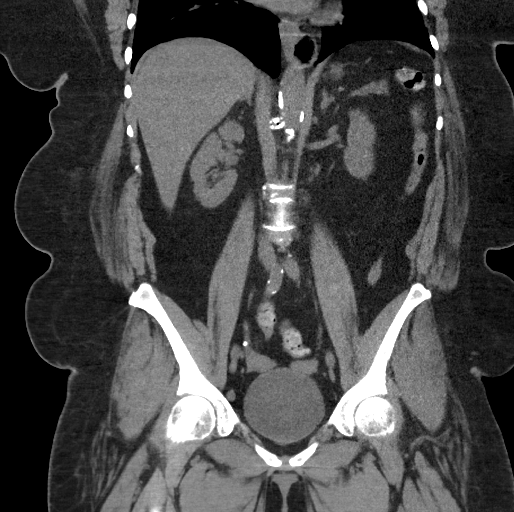

[17 of 46 positions shown; findings below may reference images not displayed]

FINDINGS: Lower chest: Lung bases are clear.  Small hiatal hernia.

Hepatobiliary: No focal liver abnormality. Gallstones are present.
No biliary dilatation.

Pancreas: Unremarkable.

Spleen: Unremarkable.

Adrenals/Urinary Tract: Adrenals are unremarkable. There is no
hydronephrosis. No renal calculi. Bladder is unremarkable.

Stomach/Bowel: Stomach is within normal limits apart from hiatal
hernia. Bowel is normal in caliber.

Vascular/Lymphatic: Aortic atherosclerosis. No enlarged lymph nodes.

Reproductive: Status post hysterectomy. No adnexal masses.

Other: No free fluid.  Abdominal wall is unremarkable.

Musculoskeletal: Degenerative changes of the included spine.
IMPRESSION: No urinary tract calculi.

Cholelithiasis.

Small hiatal hernia.

Aortic atherosclerosis.

## 2021-10-29 DIAGNOSIS — I700 Atherosclerosis of aorta: Secondary | ICD-10-CM | POA: Diagnosis not present

## 2021-10-29 DIAGNOSIS — E1121 Type 2 diabetes mellitus with diabetic nephropathy: Secondary | ICD-10-CM | POA: Diagnosis not present

## 2021-10-29 DIAGNOSIS — K219 Gastro-esophageal reflux disease without esophagitis: Secondary | ICD-10-CM | POA: Diagnosis not present

## 2021-10-29 DIAGNOSIS — N184 Chronic kidney disease, stage 4 (severe): Secondary | ICD-10-CM | POA: Diagnosis not present

## 2021-10-29 DIAGNOSIS — I10 Essential (primary) hypertension: Secondary | ICD-10-CM | POA: Diagnosis not present

## 2021-10-31 DIAGNOSIS — K219 Gastro-esophageal reflux disease without esophagitis: Secondary | ICD-10-CM | POA: Diagnosis not present

## 2021-10-31 DIAGNOSIS — I10 Essential (primary) hypertension: Secondary | ICD-10-CM | POA: Diagnosis not present

## 2021-10-31 DIAGNOSIS — N184 Chronic kidney disease, stage 4 (severe): Secondary | ICD-10-CM | POA: Diagnosis not present

## 2021-10-31 DIAGNOSIS — E1121 Type 2 diabetes mellitus with diabetic nephropathy: Secondary | ICD-10-CM | POA: Diagnosis not present

## 2021-10-31 DIAGNOSIS — I700 Atherosclerosis of aorta: Secondary | ICD-10-CM | POA: Diagnosis not present

## 2022-01-21 DIAGNOSIS — H401121 Primary open-angle glaucoma, left eye, mild stage: Secondary | ICD-10-CM | POA: Diagnosis not present

## 2022-03-03 ENCOUNTER — Telehealth: Payer: Self-pay | Admitting: *Deleted

## 2022-03-03 NOTE — Chronic Care Management (AMB) (Signed)
  Care Coordination   Note   03/03/2022 Name: ARNESHIA ADE MRN: [id number] DOB: 1950/11/07  Katherine Stokes is a 71 y.o. year old female who sees Hasanaj, Samul Dada, MD for primary care. I reached out to Katherine Stokes by phone today to offer care coordination services.  [name] was given information about Care Coordination services today including:   The Care Coordination services include support from the care team which includes your Nurse Coordinator, Clinical Social Worker, or Pharmacist.  The Care Coordination team is here to help remove barriers to the health concerns and goals most important to you. Care Coordination services are voluntary, and the patient may decline or stop services at any time by request to their care team member.   Care Coordination Consent Status: Patient did not agree to participate in care coordination services at this time.    Encounter Outcome:  Pt. Refused working with [place]  Direct Dial: [phone]

## 2022-03-03 NOTE — Chronic Care Management (AMB) (Signed)
  Care Coordination  Outreach Note  03/03/2022 Name: Katherine Stokes MRN: [id number] DOB: 1950/12/30   Care Coordination Outreach Attempts: An unsuccessful telephone outreach was attempted today to offer the patient information about available care coordination services as a benefit of their health plan.   Follow Up Plan:  Additional outreach attempts will be made to offer the patient care coordination information and services.   Encounter Outcome:  No Answer  [place]  Direct Dial: [phone]

## 2022-03-10 DIAGNOSIS — K219 Gastro-esophageal reflux disease without esophagitis: Secondary | ICD-10-CM | POA: Diagnosis not present

## 2022-03-10 DIAGNOSIS — E1121 Type 2 diabetes mellitus with diabetic nephropathy: Secondary | ICD-10-CM | POA: Diagnosis not present

## 2022-03-10 DIAGNOSIS — M25561 Pain in right knee: Secondary | ICD-10-CM | POA: Diagnosis not present

## 2022-03-10 DIAGNOSIS — I700 Atherosclerosis of aorta: Secondary | ICD-10-CM | POA: Diagnosis not present

## 2022-03-10 DIAGNOSIS — I10 Essential (primary) hypertension: Secondary | ICD-10-CM | POA: Diagnosis not present

## 2022-03-10 DIAGNOSIS — N184 Chronic kidney disease, stage 4 (severe): Secondary | ICD-10-CM | POA: Diagnosis not present

## 2022-05-07 ENCOUNTER — Ambulatory Visit: Payer: Medicare Other | Admitting: Orthopaedic Surgery

## 2022-05-24 DIAGNOSIS — E119 Type 2 diabetes mellitus without complications: Secondary | ICD-10-CM | POA: Diagnosis not present

## 2022-05-24 DIAGNOSIS — Z880 Allergy status to penicillin: Secondary | ICD-10-CM | POA: Diagnosis not present

## 2022-05-24 DIAGNOSIS — I10 Essential (primary) hypertension: Secondary | ICD-10-CM | POA: Diagnosis not present

## 2022-05-24 DIAGNOSIS — Z882 Allergy status to sulfonamides status: Secondary | ICD-10-CM | POA: Diagnosis not present

## 2022-05-24 DIAGNOSIS — M5441 Lumbago with sciatica, right side: Secondary | ICD-10-CM | POA: Diagnosis not present

## 2022-05-24 DIAGNOSIS — M25551 Pain in right hip: Secondary | ICD-10-CM | POA: Diagnosis not present

## 2022-05-24 DIAGNOSIS — M5431 Sciatica, right side: Secondary | ICD-10-CM | POA: Diagnosis not present

## 2022-06-10 DIAGNOSIS — I10 Essential (primary) hypertension: Secondary | ICD-10-CM | POA: Diagnosis not present

## 2022-06-10 DIAGNOSIS — M25561 Pain in right knee: Secondary | ICD-10-CM | POA: Diagnosis not present

## 2022-06-10 DIAGNOSIS — N184 Chronic kidney disease, stage 4 (severe): Secondary | ICD-10-CM | POA: Diagnosis not present

## 2022-06-10 DIAGNOSIS — I700 Atherosclerosis of aorta: Secondary | ICD-10-CM | POA: Diagnosis not present

## 2022-06-10 DIAGNOSIS — E1121 Type 2 diabetes mellitus with diabetic nephropathy: Secondary | ICD-10-CM | POA: Diagnosis not present

## 2022-06-10 DIAGNOSIS — M5430 Sciatica, unspecified side: Secondary | ICD-10-CM | POA: Diagnosis not present

## 2022-06-10 DIAGNOSIS — E7849 Other hyperlipidemia: Secondary | ICD-10-CM | POA: Diagnosis not present

## 2022-06-10 DIAGNOSIS — K219 Gastro-esophageal reflux disease without esophagitis: Secondary | ICD-10-CM | POA: Diagnosis not present

## 2022-08-25 DIAGNOSIS — K219 Gastro-esophageal reflux disease without esophagitis: Secondary | ICD-10-CM | POA: Diagnosis not present

## 2022-08-25 DIAGNOSIS — I700 Atherosclerosis of aorta: Secondary | ICD-10-CM | POA: Diagnosis not present

## 2022-08-25 DIAGNOSIS — N1832 Chronic kidney disease, stage 3b: Secondary | ICD-10-CM | POA: Diagnosis not present

## 2022-08-25 DIAGNOSIS — E7849 Other hyperlipidemia: Secondary | ICD-10-CM | POA: Diagnosis not present

## 2022-08-25 DIAGNOSIS — Z0000 Encounter for general adult medical examination without abnormal findings: Secondary | ICD-10-CM | POA: Diagnosis not present

## 2022-08-25 DIAGNOSIS — I10 Essential (primary) hypertension: Secondary | ICD-10-CM | POA: Diagnosis not present

## 2022-08-25 DIAGNOSIS — E1121 Type 2 diabetes mellitus with diabetic nephropathy: Secondary | ICD-10-CM | POA: Diagnosis not present

## 2022-08-25 DIAGNOSIS — Z1331 Encounter for screening for depression: Secondary | ICD-10-CM | POA: Diagnosis not present

## 2022-08-25 DIAGNOSIS — M5430 Sciatica, unspecified side: Secondary | ICD-10-CM | POA: Diagnosis not present

## 2022-09-10 DIAGNOSIS — H401121 Primary open-angle glaucoma, left eye, mild stage: Secondary | ICD-10-CM | POA: Diagnosis not present

## 2022-09-16 DIAGNOSIS — R928 Other abnormal and inconclusive findings on diagnostic imaging of breast: Secondary | ICD-10-CM | POA: Diagnosis not present

## 2022-09-16 DIAGNOSIS — R921 Mammographic calcification found on diagnostic imaging of breast: Secondary | ICD-10-CM | POA: Diagnosis not present

## 2022-09-16 DIAGNOSIS — R920 Mammographic microcalcification found on diagnostic imaging of breast: Secondary | ICD-10-CM | POA: Diagnosis not present

## 2022-09-16 DIAGNOSIS — R92323 Mammographic fibroglandular density, bilateral breasts: Secondary | ICD-10-CM | POA: Diagnosis not present

## 2022-09-21 DIAGNOSIS — M810 Age-related osteoporosis without current pathological fracture: Secondary | ICD-10-CM | POA: Diagnosis not present

## 2022-09-21 DIAGNOSIS — Z780 Asymptomatic menopausal state: Secondary | ICD-10-CM | POA: Diagnosis not present

## 2023-03-09 DIAGNOSIS — E1121 Type 2 diabetes mellitus with diabetic nephropathy: Secondary | ICD-10-CM | POA: Diagnosis not present

## 2023-03-09 DIAGNOSIS — Z0000 Encounter for general adult medical examination without abnormal findings: Secondary | ICD-10-CM | POA: Diagnosis not present

## 2023-03-09 DIAGNOSIS — Z1331 Encounter for screening for depression: Secondary | ICD-10-CM | POA: Diagnosis not present

## 2023-03-09 DIAGNOSIS — I10 Essential (primary) hypertension: Secondary | ICD-10-CM | POA: Diagnosis not present

## 2023-03-09 DIAGNOSIS — I700 Atherosclerosis of aorta: Secondary | ICD-10-CM | POA: Diagnosis not present

## 2023-03-09 DIAGNOSIS — N1832 Chronic kidney disease, stage 3b: Secondary | ICD-10-CM | POA: Diagnosis not present

## 2023-03-09 DIAGNOSIS — M5430 Sciatica, unspecified side: Secondary | ICD-10-CM | POA: Diagnosis not present

## 2023-03-09 DIAGNOSIS — K219 Gastro-esophageal reflux disease without esophagitis: Secondary | ICD-10-CM | POA: Diagnosis not present

## 2023-03-09 DIAGNOSIS — E7849 Other hyperlipidemia: Secondary | ICD-10-CM | POA: Diagnosis not present

## 2023-06-08 DIAGNOSIS — M5430 Sciatica, unspecified side: Secondary | ICD-10-CM | POA: Diagnosis not present

## 2023-06-08 DIAGNOSIS — I700 Atherosclerosis of aorta: Secondary | ICD-10-CM | POA: Diagnosis not present

## 2023-06-08 DIAGNOSIS — K219 Gastro-esophageal reflux disease without esophagitis: Secondary | ICD-10-CM | POA: Diagnosis not present

## 2023-06-08 DIAGNOSIS — N1832 Chronic kidney disease, stage 3b: Secondary | ICD-10-CM | POA: Diagnosis not present

## 2023-06-08 DIAGNOSIS — E1122 Type 2 diabetes mellitus with diabetic chronic kidney disease: Secondary | ICD-10-CM | POA: Diagnosis not present

## 2023-06-08 DIAGNOSIS — E7849 Other hyperlipidemia: Secondary | ICD-10-CM | POA: Diagnosis not present

## 2023-06-08 DIAGNOSIS — I10 Essential (primary) hypertension: Secondary | ICD-10-CM | POA: Diagnosis not present

## 2023-06-19 DIAGNOSIS — S52612A Displaced fracture of left ulna styloid process, initial encounter for closed fracture: Secondary | ICD-10-CM | POA: Diagnosis not present

## 2023-06-19 DIAGNOSIS — M79642 Pain in left hand: Secondary | ICD-10-CM | POA: Diagnosis not present

## 2023-06-19 DIAGNOSIS — M1000 Idiopathic gout, unspecified site: Secondary | ICD-10-CM | POA: Diagnosis not present

## 2023-06-19 DIAGNOSIS — E119 Type 2 diabetes mellitus without complications: Secondary | ICD-10-CM | POA: Diagnosis not present

## 2023-06-19 DIAGNOSIS — M19042 Primary osteoarthritis, left hand: Secondary | ICD-10-CM | POA: Diagnosis not present

## 2023-06-19 DIAGNOSIS — Z880 Allergy status to penicillin: Secondary | ICD-10-CM | POA: Diagnosis not present

## 2023-06-19 DIAGNOSIS — Z882 Allergy status to sulfonamides status: Secondary | ICD-10-CM | POA: Diagnosis not present

## 2023-06-19 DIAGNOSIS — I10 Essential (primary) hypertension: Secondary | ICD-10-CM | POA: Diagnosis not present

## 2023-06-29 DIAGNOSIS — M1000 Idiopathic gout, unspecified site: Secondary | ICD-10-CM | POA: Diagnosis not present

## 2023-06-29 DIAGNOSIS — I10 Essential (primary) hypertension: Secondary | ICD-10-CM | POA: Diagnosis not present

## 2023-08-18 DIAGNOSIS — I69359 Hemiplegia and hemiparesis following cerebral infarction affecting unspecified side: Secondary | ICD-10-CM | POA: Diagnosis not present

## 2023-08-18 DIAGNOSIS — Z794 Long term (current) use of insulin: Secondary | ICD-10-CM | POA: Diagnosis not present

## 2023-08-18 DIAGNOSIS — N1832 Chronic kidney disease, stage 3b: Secondary | ICD-10-CM | POA: Diagnosis not present

## 2023-08-18 DIAGNOSIS — R55 Syncope and collapse: Secondary | ICD-10-CM | POA: Diagnosis not present

## 2023-08-18 DIAGNOSIS — I6789 Other cerebrovascular disease: Secondary | ICD-10-CM | POA: Diagnosis not present

## 2023-08-18 DIAGNOSIS — I700 Atherosclerosis of aorta: Secondary | ICD-10-CM | POA: Diagnosis not present

## 2023-08-18 DIAGNOSIS — E08649 Diabetes mellitus due to underlying condition with hypoglycemia without coma: Secondary | ICD-10-CM | POA: Diagnosis not present

## 2023-08-18 DIAGNOSIS — E161 Other hypoglycemia: Secondary | ICD-10-CM | POA: Diagnosis not present

## 2023-08-18 DIAGNOSIS — G9341 Metabolic encephalopathy: Secondary | ICD-10-CM | POA: Diagnosis not present

## 2023-08-18 DIAGNOSIS — R0902 Hypoxemia: Secondary | ICD-10-CM | POA: Diagnosis not present

## 2023-08-18 DIAGNOSIS — I6381 Other cerebral infarction due to occlusion or stenosis of small artery: Secondary | ICD-10-CM | POA: Diagnosis not present

## 2023-08-18 DIAGNOSIS — Z8673 Personal history of transient ischemic attack (TIA), and cerebral infarction without residual deficits: Secondary | ICD-10-CM | POA: Diagnosis not present

## 2023-08-18 DIAGNOSIS — I6523 Occlusion and stenosis of bilateral carotid arteries: Secondary | ICD-10-CM | POA: Diagnosis not present

## 2023-08-18 DIAGNOSIS — G9340 Encephalopathy, unspecified: Secondary | ICD-10-CM | POA: Diagnosis not present

## 2023-08-18 DIAGNOSIS — E11649 Type 2 diabetes mellitus with hypoglycemia without coma: Secondary | ICD-10-CM | POA: Diagnosis not present

## 2023-08-18 DIAGNOSIS — R4182 Altered mental status, unspecified: Secondary | ICD-10-CM | POA: Diagnosis not present

## 2023-08-18 DIAGNOSIS — R410 Disorientation, unspecified: Secondary | ICD-10-CM | POA: Diagnosis not present

## 2023-08-18 DIAGNOSIS — I10 Essential (primary) hypertension: Secondary | ICD-10-CM | POA: Diagnosis not present

## 2023-08-18 DIAGNOSIS — R4781 Slurred speech: Secondary | ICD-10-CM | POA: Diagnosis not present

## 2023-08-18 DIAGNOSIS — R5381 Other malaise: Secondary | ICD-10-CM | POA: Diagnosis not present

## 2023-08-18 DIAGNOSIS — R404 Transient alteration of awareness: Secondary | ICD-10-CM | POA: Diagnosis not present

## 2023-08-18 DIAGNOSIS — I671 Cerebral aneurysm, nonruptured: Secondary | ICD-10-CM | POA: Diagnosis not present

## 2023-08-18 DIAGNOSIS — R220 Localized swelling, mass and lump, head: Secondary | ICD-10-CM | POA: Diagnosis not present

## 2023-08-18 DIAGNOSIS — E1122 Type 2 diabetes mellitus with diabetic chronic kidney disease: Secondary | ICD-10-CM | POA: Diagnosis not present

## 2023-08-18 DIAGNOSIS — R0989 Other specified symptoms and signs involving the circulatory and respiratory systems: Secondary | ICD-10-CM | POA: Diagnosis not present

## 2023-08-18 DIAGNOSIS — I672 Cerebral atherosclerosis: Secondary | ICD-10-CM | POA: Diagnosis not present

## 2023-08-19 DIAGNOSIS — G9341 Metabolic encephalopathy: Secondary | ICD-10-CM | POA: Diagnosis not present

## 2023-08-19 DIAGNOSIS — R4182 Altered mental status, unspecified: Secondary | ICD-10-CM | POA: Diagnosis not present

## 2023-08-19 DIAGNOSIS — E1122 Type 2 diabetes mellitus with diabetic chronic kidney disease: Secondary | ICD-10-CM | POA: Diagnosis not present

## 2023-08-19 DIAGNOSIS — Z79899 Other long term (current) drug therapy: Secondary | ICD-10-CM | POA: Diagnosis not present

## 2023-08-19 DIAGNOSIS — I6782 Cerebral ischemia: Secondary | ICD-10-CM | POA: Diagnosis not present

## 2023-08-19 DIAGNOSIS — E11649 Type 2 diabetes mellitus with hypoglycemia without coma: Secondary | ICD-10-CM | POA: Diagnosis not present

## 2023-08-19 DIAGNOSIS — Z8673 Personal history of transient ischemic attack (TIA), and cerebral infarction without residual deficits: Secondary | ICD-10-CM | POA: Diagnosis not present

## 2023-08-19 DIAGNOSIS — N1832 Chronic kidney disease, stage 3b: Secondary | ICD-10-CM | POA: Diagnosis not present

## 2023-08-20 DIAGNOSIS — N1832 Chronic kidney disease, stage 3b: Secondary | ICD-10-CM | POA: Diagnosis not present

## 2023-08-20 DIAGNOSIS — G9341 Metabolic encephalopathy: Secondary | ICD-10-CM | POA: Diagnosis not present

## 2023-08-20 DIAGNOSIS — R4182 Altered mental status, unspecified: Secondary | ICD-10-CM | POA: Diagnosis not present

## 2023-08-20 DIAGNOSIS — E11649 Type 2 diabetes mellitus with hypoglycemia without coma: Secondary | ICD-10-CM | POA: Diagnosis not present

## 2023-08-20 DIAGNOSIS — Z8673 Personal history of transient ischemic attack (TIA), and cerebral infarction without residual deficits: Secondary | ICD-10-CM | POA: Diagnosis not present

## 2023-08-20 DIAGNOSIS — Z79899 Other long term (current) drug therapy: Secondary | ICD-10-CM | POA: Diagnosis not present

## 2023-08-26 DIAGNOSIS — I10 Essential (primary) hypertension: Secondary | ICD-10-CM | POA: Diagnosis not present

## 2023-08-26 DIAGNOSIS — E16A2 Hypoglycemia level 2: Secondary | ICD-10-CM | POA: Diagnosis not present

## 2023-09-07 DIAGNOSIS — N1832 Chronic kidney disease, stage 3b: Secondary | ICD-10-CM | POA: Diagnosis not present

## 2023-09-07 DIAGNOSIS — J0100 Acute maxillary sinusitis, unspecified: Secondary | ICD-10-CM | POA: Diagnosis not present

## 2023-09-07 DIAGNOSIS — E1122 Type 2 diabetes mellitus with diabetic chronic kidney disease: Secondary | ICD-10-CM | POA: Diagnosis not present

## 2023-09-07 DIAGNOSIS — G458 Other transient cerebral ischemic attacks and related syndromes: Secondary | ICD-10-CM | POA: Diagnosis not present

## 2023-09-07 DIAGNOSIS — E782 Mixed hyperlipidemia: Secondary | ICD-10-CM | POA: Diagnosis not present

## 2023-09-07 DIAGNOSIS — Z1331 Encounter for screening for depression: Secondary | ICD-10-CM | POA: Diagnosis not present

## 2023-09-07 DIAGNOSIS — E16A2 Hypoglycemia level 2: Secondary | ICD-10-CM | POA: Diagnosis not present

## 2023-09-07 DIAGNOSIS — E7849 Other hyperlipidemia: Secondary | ICD-10-CM | POA: Diagnosis not present

## 2023-09-07 DIAGNOSIS — I10 Essential (primary) hypertension: Secondary | ICD-10-CM | POA: Diagnosis not present

## 2023-09-07 DIAGNOSIS — M1000 Idiopathic gout, unspecified site: Secondary | ICD-10-CM | POA: Diagnosis not present

## 2023-09-07 DIAGNOSIS — Z0000 Encounter for general adult medical examination without abnormal findings: Secondary | ICD-10-CM | POA: Diagnosis not present

## 2023-12-20 DIAGNOSIS — K219 Gastro-esophageal reflux disease without esophagitis: Secondary | ICD-10-CM | POA: Diagnosis not present

## 2023-12-20 DIAGNOSIS — M1000 Idiopathic gout, unspecified site: Secondary | ICD-10-CM | POA: Diagnosis not present

## 2023-12-20 DIAGNOSIS — E7849 Other hyperlipidemia: Secondary | ICD-10-CM | POA: Diagnosis not present

## 2023-12-20 DIAGNOSIS — E1122 Type 2 diabetes mellitus with diabetic chronic kidney disease: Secondary | ICD-10-CM | POA: Diagnosis not present

## 2023-12-20 DIAGNOSIS — I10 Essential (primary) hypertension: Secondary | ICD-10-CM | POA: Diagnosis not present

## 2023-12-20 DIAGNOSIS — E16A2 Hypoglycemia level 2: Secondary | ICD-10-CM | POA: Diagnosis not present

## 2023-12-20 DIAGNOSIS — Z8673 Personal history of transient ischemic attack (TIA), and cerebral infarction without residual deficits: Secondary | ICD-10-CM | POA: Diagnosis not present

## 2023-12-20 DIAGNOSIS — N1832 Chronic kidney disease, stage 3b: Secondary | ICD-10-CM | POA: Diagnosis not present

## 2024-02-09 DIAGNOSIS — Z1231 Encounter for screening mammogram for malignant neoplasm of breast: Secondary | ICD-10-CM | POA: Diagnosis not present

## 2024-03-07 DIAGNOSIS — Z882 Allergy status to sulfonamides status: Secondary | ICD-10-CM | POA: Diagnosis not present

## 2024-03-07 DIAGNOSIS — Z20822 Contact with and (suspected) exposure to covid-19: Secondary | ICD-10-CM | POA: Diagnosis not present

## 2024-03-07 DIAGNOSIS — Z1152 Encounter for screening for COVID-19: Secondary | ICD-10-CM | POA: Diagnosis not present

## 2024-03-07 DIAGNOSIS — I6523 Occlusion and stenosis of bilateral carotid arteries: Secondary | ICD-10-CM | POA: Diagnosis not present

## 2024-03-07 DIAGNOSIS — Z880 Allergy status to penicillin: Secondary | ICD-10-CM | POA: Diagnosis not present

## 2024-03-07 DIAGNOSIS — N390 Urinary tract infection, site not specified: Secondary | ICD-10-CM | POA: Diagnosis not present

## 2024-03-07 DIAGNOSIS — G9389 Other specified disorders of brain: Secondary | ICD-10-CM | POA: Diagnosis not present

## 2024-03-07 DIAGNOSIS — R413 Other amnesia: Secondary | ICD-10-CM | POA: Diagnosis not present

## 2024-03-07 DIAGNOSIS — Z66 Do not resuscitate: Secondary | ICD-10-CM | POA: Diagnosis not present

## 2024-03-07 NOTE — ED Provider Notes (Signed)
 Emergency Department Provider Note    ED Clinical Impression   Final diagnoses:  Urinary tract infection without hematuria, site unspecified (Primary)  Memory loss of unknown cause    ED Assessment/Plan    Condition: Stable Disposition: Discharge  This chart has been completed using Dragon Medical Dictation software, and while attempts have been made to ensure accuracy, certain words and phrases may not be transcribed as intended.   History   Chief Complaint  Patient presents with  . Memory Loss   HPI  Katherine Stokes is a 73 y.o. female  who presents today to the  emergency department complaining of increased memory loss.  Majority of history is provided by daughter at the bedside.  Daughter reports that patient has demonstrated some decreased memory over several months.  This seems to have worsened significantly over the past week.  Patient cannot remember conversations that she had the night before.  She is not remembering family members such as Art gallery manager.  She is forgetting if she is taking her medications or not.  Patient lives alone and daughter is concerned that this may become a safety issue.  Patient reports no recent illness confirmed by family.    Allergies: is allergic to penicillins and sulfa (sulfonamide antibiotics). Medications: has a current medication list which includes the following long-term medication(s): albuterol , allopurinol, aspirin , dexcom g7 receiver, dexcom g7 sensor, carvedilol , lantus solostar u-100 insulin , and olmesartan. PMHx:  has a past medical history of Diabetes mellitus    (CMS-HCC), Gout, and Hypertension. PSHx:  has a past surgical history that includes Hysterectomy. SocHx:  reports that she has never smoked. She has never used smokeless tobacco. She reports that she does not drink alcohol and does not use drugs. Allergies, Medications,  Medical, Surgical, and Social History were reviewed as documented above.   Social Drivers of Health with Concerns   Physical Activity: Inactive (08/19/2023)   Exercise Vital Sign   . Days of Exercise per Week: 0 days   . Minutes of Exercise per Session: 0 min  Internet Connectivity: Internet connectivity concern identified (08/19/2023)   Internet Connectivity   . Do you have access to internet services: No   . How do you connect to the internet: Not on file   . Is your internet connection strong enough for you to watch video on your device without major problems?: Not on file   . Do you have enough data to get through the month?: Not on file   . Does at least one of the devices have a camera that you can use for video chat?: Not on file     Review Of Systems  Review of Systems  Constitutional: Negative.   HENT: Negative.    Eyes: Negative.   Respiratory: Negative.    Cardiovascular: Negative.   Gastrointestinal: Negative.   Endocrine: Negative.   Genitourinary: Negative.   Musculoskeletal: Negative.   Skin: Negative.   Allergic/Immunologic: Negative.   Neurological: Negative.        Memory loss  Hematological: Negative.   Psychiatric/Behavioral: Negative.    All other systems reviewed and are negative.   Physical Exam   BP 144/73   Pulse 76   Temp 36.4 C (97.6 F) (Temporal)   Resp 16   Ht 157.5 cm (5' 2)  Wt 89.3 kg (196 lb 12.8 oz)   SpO2 100%   BMI 36.00 kg/m   Physical Exam Vitals and nursing note reviewed.  Constitutional:      General: She is not in acute distress.    Appearance: Normal appearance. She is well-developed. She is not ill-appearing, toxic-appearing or diaphoretic.  HENT:     Head: Normocephalic and atraumatic.     Right Ear: External ear normal.     Left Ear: External ear normal.     Mouth/Throat:     Mouth: Mucous membranes are moist.     Pharynx: Oropharynx is clear.  Eyes:     General: No visual field deficit or scleral  icterus.    Conjunctiva/sclera: Conjunctivae normal.     Pupils: Pupils are equal.  Neck:     Vascular: No carotid bruit.  Cardiovascular:     Rate and Rhythm: Normal rate and regular rhythm.     Pulses: Normal pulses.     Heart sounds: Murmur heard.     Comments: Mild 1 out of 6 systolic ejection heart murmur Pulmonary:     Effort: Pulmonary effort is normal.     Breath sounds: Normal breath sounds.  Abdominal:     General: Bowel sounds are normal. There is no distension.     Palpations: Abdomen is soft. There is no mass.     Tenderness: There is no abdominal tenderness. There is no guarding.  Musculoskeletal:        General: Normal range of motion.     Cervical back: Normal range of motion and neck supple. No rigidity or tenderness.     Right lower leg: No edema.     Left lower leg: No edema.  Lymphadenopathy:     Cervical: No cervical adenopathy.  Skin:    General: Skin is warm and dry.  Neurological:     General: No focal deficit present.     Mental Status: She is alert and oriented to person, place, and time.     Cranial Nerves: No cranial nerve deficit, dysarthria or facial asymmetry.     Motor: No weakness.     Coordination: Coordination normal.     Gait: Gait normal.  Psychiatric:        Mood and Affect: Mood normal.        Speech: Speech normal.        Behavior: Behavior normal.        Thought Content: Thought content normal.        Judgment: Judgment normal.     ED Course  Medical Decision Making Differential diagnosis includes:  Alzheimer's ds, vascular dementia, Thyroid  d/o, Vitamin B12 deficiency  Katherine Stokes is a 73 y.o. female  who presents today to the  emergency department complaining of increased memory loss.  Majority of history is provided by daughter at the bedside.  Daughter reports that patient has demonstrated some decreased memory over several months.  This seems to have worsened significantly over the past week.   -CBC unremarkable.   Chemistries demonstrated a chronic kidney disease GFR at baseline.  No other concerning electrolyte abnormalities  - TSH, magnesium normal.  Influenza, COVID and RSV resulted negative  -urinalysis demonstrated negative nitrites with positive leukocyte esterase.  White blood cells 18 with a few bacteria.  This was a clean sample.  Patient will be treated for urinary tract infection  - Vitamin B12 level pending  -We discussed all available results at the bedside.  Patient was  started on Macrobid for her GI given the penicillin and sulfa allergy  -Family is encouraged to follow-up with patient's primary care physician for reevaluation and additional testing to include concerns for Alzheimer's dementia       Procedures   No results found for this visit on 03/07/24 (from the past 4464 hours).   ED Results Results for orders placed or performed during the hospital encounter of 03/07/24  Influenza/ RSV/COVID PCR   Specimen: Nasopharyngeal Swab  Result Value Ref Range   SARS-CoV-2 PCR Negative Negative   Influenza A Negative Negative   Influenza B Negative Negative   RSV Negative Negative  Comprehensive Metabolic Panel  Result Value Ref Range   Sodium 139 135 - 145 mmol/L   Potassium 4.1 3.5 - 5.0 mmol/L   Chloride 104 98 - 107 mmol/L   CO2 23.9 21.0 - 32.0 mmol/L   Anion Gap 11 3 - 11 mmol/L   BUN 27 (H) 8 - 20 mg/dL   Creatinine 8.28 (H) 9.39 - 1.10 mg/dL   BUN/Creatinine Ratio 16    eGFR CKD-EPI (2021) Female 31 (L) >=60 mL/min/1.36m2   Glucose 167 70 - 179 mg/dL   Calcium  9.4 8.5 - 10.1 mg/dL   Albumin  3.3 (L) 3.5 - 5.0 g/dL   Total Protein 8.3 (H) 6.0 - 8.0 g/dL   Total Bilirubin 0.4 0.3 - 1.2 mg/dL   AST 11 (L) 15 - 40 U/L   ALT 17 12 - 78 U/L   Alkaline Phosphatase 79 46 - 116 U/L  Magnesium  Result Value Ref Range   Magnesium 1.9 1.6 - 2.6 mg/dL  PT-INR  Result Value Ref Range   PT 11.1 9.9 - 12.6 sec   INR 1.00 Undefined  PTT  Result Value Ref Range   APTT  32.9 24.8 - 38.4 sec  TSH  Result Value Ref Range   TSH 1.408 0.550 - 4.780 uIU/mL  POCT Glucose  Result Value Ref Range   Glucose, POC 163 (H) 70 - 105 mg/dL  CBC w/ Differential  Result Value Ref Range   WBC 10.3 4.0 - 10.5 10*9/L   RBC 4.23 3.80 - 5.10 10*12/L   HGB 12.6 11.5 - 15.0 g/dL   HCT 60.9 65.9 - 55.9 %   MCV 92.2 80.0 - 98.0 fL   MCH 29.8 27.0 - 34.0 pg   MCHC 32.3 32.0 - 36.0 g/dL   RDW 84.5 (H) 88.4 - 85.4 %   MPV 11.9 (H) 7.4 - 10.4 fL   Platelet 341 140 - 415 10*9/L   Neutrophils % 54.0 %   Lymphocytes % 38.9 %   Monocytes % 4.3 %   Eosinophils % 2.0 %   Basophils % 0.5 %   Absolute Neutrophils 5.6 1.8 - 7.8 10*9/L   Absolute Lymphocytes 4.0 0.7 - 4.5 10*9/L   Absolute Monocytes 0.4 0.1 - 1.0 10*9/L   Absolute Eosinophils 0.2 0.0 - 0.4 10*9/L   Absolute Basophils 0.1 0.0 - 0.2 10*9/L  Urinalysis with Microscopy with Culture Reflex  Result Value Ref Range   Color, UA Light Yellow    Clarity, UA Clear Clear   Specific Gravity, UA 1.020 1.010 - 1.025   pH, UA 5.5 5.0 - 8.0   Leukocyte Esterase, UA Large (A) Negative   Nitrite, UA Negative Negative   Protein, UA 30 mg/dL (A) Negative   Glucose, UA >1000 mg/dL (A) Negative, Trace   Ketones, UA Negative Negative   Urobilinogen, UA <2.0 mg/dL <7.9 mg/dL  Bilirubin, UA Negative Negative   Blood, UA Negative Negative   RBC, UA 11 (H) 0 - 3 /HPF   WBC, UA 18 (H) 0 - 3 /HPF   Squam Epithel, UA 7 0 - 10 /HPF   Bacteria, UA Few (A) None Seen /HPF   WBC Clumps None Seen None Seen /HPF   Hyphal Yeast None Seen None Seen /HPF   Hyaline Casts, UA 1 (H) <=0 /LPF   Yeast, UA None Seen None Seen /HPF   CT Head Wo Contrast Result Date: 03/07/2024 Exam:  CT Head without Contrast  History:  Altered mental status.  Technique: Routine brain CT without IV contrast. AEC (automated exposure control) and/or manual techniques such as size-specific kV and mAs are employed where appropriate to reduce radiation exposure for all  CT exams.  Comparison:  08/18/2023.  Findings:  No acute intracranial hemorrhage, large territorial infarct, or mass effect.  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  Global cerebral volume loss, within limits for age. Patchy periventricular and deep white matter hypoattenuation, likely sequela of chronic microvascular ischemia. Severe calcified atherosclerosis of the bilateral cavernous carotid arteries.  Ventricles unchanged in size and morphology with ex vacuo dilation.  No acute fracture the calvarium, skull base, or visualized face. Paranasal sinuses and mastoid air cells clear.  Soft tissues of the scalp and visualized face unremarkable. Orbits and globes unremarkable.    No acute intracranial findings.  Signed (Electronic Signature): 03/07/2024 11:05 AM Signed By: Emelia Bame, MD   Medications Administered: Medications - No data to display  Discharge Medications (Medications Prescribed during this  ED visit and Patient's Home Medications) :    Your Medication List     START taking these medications    nitrofurantoin (macrocrystal-monohydrate) 100 MG capsule Commonly known as: MACROBID Take 1 capsule (100 mg total) by mouth two (2) times a day for 7 days.       ASK your doctor about these medications    albuterol  2.5 mg /3 mL (0.083 %) nebulizer solution Inhale 3 mL (2.5 mg total) every six (6) hours as needed for shortness of breath or wheezing.   allopurinol 300 MG tablet Commonly known as: ZYLOPRIM Take 1 tablet (300 mg total) by mouth daily.   aspirin  81 MG tablet Commonly known as: ECOTRIN Take 2 tablets (162 mg total) by mouth daily.   carvedilol  25 MG tablet Commonly known as: COREG  Take 1 tablet (25 mg total) by mouth two (2) times a day.   cholecalciferol (vitamin D3 25 mcg (1,000 units)) 1,000 unit (25 mcg) tablet Take 1 tablet (25 mcg total) by mouth daily.   cyanocobalamin 500 MCG tablet Take 1 tablet (500 mcg total) by  mouth daily.   dapagliflozin propanediol 5 mg Tab tablet Commonly known as: FARXIGA Take 1 tablet (5 mg total) by mouth daily.   DEXCOM G7 RECEIVER Misc Generic drug: blood-glucose,receiver,cont 1 each by Miscellaneous route once for 1 dose.   DEXCOM G7 SENSOR Devi Generic drug: blood-glucose sensor 3 each by Miscellaneous route in the morning.   guaiFENesin 100 mg/5 mL syrup Commonly known as: ROBITUSSIN Take 10 mL (200 mg total) by mouth every four (4) hours as needed for congestion.   LANTUS SOLOSTAR U-100 INSULIN  100 unit/mL (3 mL) injection pen Generic drug: insulin  glargine Inject 0.25 mL (25 Units total) under the skin nightly.   olmesartan 40 MG tablet Commonly known as: BENICAR Take 1 tablet (40 mg total) by mouth daily.  pantoprazole  40 MG tablet Commonly known as: Protonix  Take 1 tablet (40 mg total) by mouth daily before breakfast.          Merilee Lani Sharper, MD 03/07/24 [phone]

## 2024-03-13 DIAGNOSIS — M5418 Radiculopathy, sacral and sacrococcygeal region: Secondary | ICD-10-CM | POA: Diagnosis not present

## 2024-03-13 DIAGNOSIS — Z6834 Body mass index (BMI) 34.0-34.9, adult: Secondary | ICD-10-CM | POA: Diagnosis not present

## 2024-03-13 DIAGNOSIS — N3091 Cystitis, unspecified with hematuria: Secondary | ICD-10-CM | POA: Diagnosis not present

## 2024-03-14 DIAGNOSIS — R739 Hyperglycemia, unspecified: Secondary | ICD-10-CM | POA: Diagnosis not present

## 2024-03-14 DIAGNOSIS — E1165 Type 2 diabetes mellitus with hyperglycemia: Secondary | ICD-10-CM | POA: Diagnosis not present

## 2024-03-14 DIAGNOSIS — T50905A Adverse effect of unspecified drugs, medicaments and biological substances, initial encounter: Secondary | ICD-10-CM | POA: Diagnosis not present

## 2024-03-14 DIAGNOSIS — Z880 Allergy status to penicillin: Secondary | ICD-10-CM | POA: Diagnosis not present

## 2024-03-14 DIAGNOSIS — T50995A Adverse effect of other drugs, medicaments and biological substances, initial encounter: Secondary | ICD-10-CM | POA: Diagnosis not present

## 2024-03-14 DIAGNOSIS — N3000 Acute cystitis without hematuria: Secondary | ICD-10-CM | POA: Diagnosis not present

## 2024-03-14 DIAGNOSIS — Z90710 Acquired absence of both cervix and uterus: Secondary | ICD-10-CM | POA: Diagnosis not present

## 2024-03-14 DIAGNOSIS — I10 Essential (primary) hypertension: Secondary | ICD-10-CM | POA: Diagnosis not present

## 2024-03-14 DIAGNOSIS — Z66 Do not resuscitate: Secondary | ICD-10-CM | POA: Diagnosis not present

## 2024-03-14 NOTE — ED Provider Notes (Signed)
 Chief complaint:  Chief Complaint  Patient presents with  . Elevated Blood Glucose Symptomatic    History is obtained from family and patient  HPI: This is a 73 y.o. female presenting to the ED with elevated blood sugar.  Patient was just given Medrol Dosepak.  She was given this for back pain.  Known diabetic.  Daughter reports that she found her blood sugar to be 483.  She was given 10 of low NovoLog  x 2 approximately 4 hours apart.  Blood glucose remained elevated.  Patient recently treated for UTI.  Has been complaining of back pain but is being followed for this by her primary care provider.  Review of Systems  All other systems reviewed and are negative.   Allergies: is allergic to penicillins and sulfa (sulfonamide antibiotics). Medications: has a current medication list which includes the following long-term medication(s): albuterol , allopurinol, aspirin , dexcom g7 receiver, dexcom g7 sensor, carvedilol , lantus solostar u-100 insulin , and olmesartan. PMHx:  has a past medical history of Diabetes mellitus    (CMS-HCC), Gout, and Hypertension. PSHx:  has a past surgical history that includes Hysterectomy. SocHx:  reports that she has never smoked. She has never used smokeless tobacco. She reports that she does not drink alcohol and does not use drugs. Allergies, Medications, Medical, Surgical, and Social History were reviewed as documented above.  Physical Exam Vitals and nursing note reviewed.  Constitutional:      General: She is not in acute distress. HENT:     Head: Normocephalic and atraumatic.     Nose: Nose normal.     Mouth/Throat:     Mouth: Mucous membranes are moist.  Eyes:     Extraocular Movements: Extraocular movements intact.     Pupils: Pupils are equal, round, and reactive to light.  Cardiovascular:     Rate and Rhythm: Normal rate and regular rhythm.     Pulses: Normal pulses.     Heart sounds: Normal heart sounds.  Pulmonary:     Effort: Pulmonary effort  is normal. No respiratory distress.     Breath sounds: Normal breath sounds. No wheezing, rhonchi or rales.  Abdominal:     General: Abdomen is flat.     Palpations: Abdomen is soft.     Tenderness: There is no abdominal tenderness. There is no guarding or rebound.  Musculoskeletal:        General: No deformity.  Lymphadenopathy:     Cervical: No cervical adenopathy.  Skin:    General: Skin is warm and dry.     Capillary Refill: Capillary refill takes less than 2 seconds.     Findings: No rash.  Neurological:     General: No focal deficit present.     Mental Status: She is alert. Mental status is at baseline.  Psychiatric:        Mood and Affect: Mood normal.        Behavior: Behavior normal.    Vitals: BP 175/74 Comment: Dr. Moody aware, ok to discharge  Pulse 62   Temp 36.6 C (97.8 F) (Oral)   Resp 16   Ht 160 cm (5' 3)   Wt 89.3 kg (196 lb 12.8 oz)   SpO2 98%   BMI 34.86 kg/m   ED Results Results for orders placed or performed during the hospital encounter of 03/14/24  Comprehensive Metabolic Panel  Result Value Ref Range   Sodium 138 135 - 145 mmol/L   Potassium 4.5 3.5 - 5.0 mmol/L   Chloride 104  98 - 107 mmol/L   CO2 23.4 21.0 - 32.0 mmol/L   Anion Gap 11 3 - 11 mmol/L   BUN 27 (H) 8 - 20 mg/dL   Creatinine 7.87 (H) 9.39 - 1.10 mg/dL   BUN/Creatinine Ratio 13    eGFR CKD-EPI (2021) Female 24 (L) >=60 mL/min/1.81m2   Glucose 325 (H) 70 - 179 mg/dL   Calcium  9.6 8.5 - 10.1 mg/dL   Albumin  3.3 (L) 3.5 - 5.0 g/dL   Total Protein 7.9 6.0 - 8.0 g/dL   Total Bilirubin 0.4 0.3 - 1.2 mg/dL   AST 7 (L) 15 - 40 U/L   ALT 13 12 - 78 U/L   Alkaline Phosphatase 71 46 - 116 U/L  ECG 12 Lead  Result Value Ref Range   EKG Systolic BP  mmHg   EKG Diastolic BP  mmHg   EKG Ventricular Rate 58 BPM   EKG Atrial Rate 58 BPM   EKG P-R Interval 188 ms   EKG QRS Duration 76 ms   EKG Q-T Interval 404 ms   EKG QTC Calculation 396 ms   EKG Calculated P Axis 31 degrees    EKG Calculated R Axis 7 degrees   EKG Calculated T Axis 60 degrees   QTC Fredericia 399 ms  POCT Glucose  Result Value Ref Range   Glucose, POC 348 (H) 70 - 105 mg/dL  POCT Glucose  Result Value Ref Range   Glucose, POC 231 (H) 70 - 105 mg/dL  CBC w/ Differential  Result Value Ref Range   WBC 15.3 (H) 4.0 - 10.5 10*9/L   RBC 4.04 3.80 - 5.10 10*12/L   HGB 12.0 11.5 - 15.0 g/dL   HCT 63.5 65.9 - 55.9 %   MCV 90.1 80.0 - 98.0 fL   MCH 29.7 27.0 - 34.0 pg   MCHC 33.0 32.0 - 36.0 g/dL   RDW 85.3 (H) 88.4 - 85.4 %   MPV 11.7 (H) 7.4 - 10.4 fL   Platelet 310 140 - 415 10*9/L   Neutrophils % 79.9 %   Lymphocytes % 16.4 %   Monocytes % 2.8 %   Eosinophils % 0.0 %   Basophils % 0.3 %   Absolute Neutrophils 12.2 (H) 1.8 - 7.8 10*9/L   Absolute Lymphocytes 2.5 0.7 - 4.5 10*9/L   Absolute Monocytes 0.4 0.1 - 1.0 10*9/L   Absolute Eosinophils 0.0 0.0 - 0.4 10*9/L   Absolute Basophils 0.1 0.0 - 0.2 10*9/L  Urinalysis with Microscopy with Culture Reflex  Result Value Ref Range   Color, UA Light Yellow    Clarity, UA Cloudy (A) Clear   Specific Gravity, UA 1.026 (H) 1.010 - 1.025   pH, UA 5.0 5.0 - 8.0   Leukocyte Esterase, UA Large (A) Negative   Nitrite, UA Negative Negative   Protein, UA Trace (A) Negative   Glucose, UA >1000 mg/dL (A) Negative, Trace   Ketones, UA Negative Negative   Urobilinogen, UA <2.0 mg/dL <7.9 mg/dL   Bilirubin, UA Negative Negative   Blood, UA Negative Negative   RBC, UA 0 0 - 3 /HPF   WBC, UA 102 (H) 0 - 3 /HPF   Squam Epithel, UA 25 (H) 0 - 10 /HPF   Bacteria, UA None Seen None Seen /HPF   WBC Clumps None Seen None Seen /HPF   Hyphal Yeast None Seen None Seen /HPF   Yeast, UA None Seen None Seen /HPF   XR Chest Portable Result Date: 03/14/2024 Exam:  Portable Chest  History:  Elevated blood glucose.  Technique:  Single frontal view.  Comparison:  08/18/2023.  Findings:   The heart size, mediastinal contours, pleural surfaces and pulmonary  vascularity are all normal. The lungs are clear. There is no detectable pleural effusion or pneumothorax. The regional bones are normal for age.    Negative portable chest x-ray exam.    Signed (Electronic Signature): 03/14/2024 2:12 PM Signed By: Thresa DELENA Goldmann, MD   Medications Administered:  Medications  sodium chloride  0.9% (NS) bolus 1,000 mL (0 mL Intravenous Stopped 03/14/24 1644)  cephalexin (KEFLEX) capsule 500 mg (500 mg Oral Given 03/14/24 1635)    Procedures  The results of any testing as well as any treatment done today in the Emergency Department were communicated to the patient and/or their family/caregiver (if present). The patient/family/caregiver verbalized understanding and compliance with the treatment plan and reasons to return immediately to the ED. All of their questions were answered at bedside  Medical Decision Making:   Medical Decision Making Patient presents with hyperglycemia without evidence of diabetic ketoacidosis.  This is likely secondary to recent steroids.  I have discussed this with patient and family and they will DC the steroids.  The patient has a history of Diabetes Mellitus.  Hyperglycemia was corrected here today with IV fluids and subcutaneous insulin . The patient did not have evidence for substantial electrolyte abnormalities.   Laboratory studies were reviewed and showed no leukocytosis or anemia. Electrolytes, LFTs, and kidney function were generally within normal limits.  Urinalysis does show continued UTI.  This is been sent for culture.  Keflex given.  Had been on Macrobid prior. At this stage there is no evidence for more malignant underlying inciting etiology for the patient's hyperglycemia such as bacterial infection, although this obviously cannot be excluded conclusively at this early stage. The history, physical exam, and/or workup is not consistent with DKA; HHNK; sepsis; MI/ACS; orthostatic hypotension; acute renal failure; or obvious  risk of hypoglycemia within 24 hours. The patient understands they must take all diabetic medications as prescribed and will likely need to discuss adjustment of their diabetic medications with their PCP. Outpatient management of Diabetes Mellitus deferred to PCP.  The patient is a good candidate for outpatient therapy given the stability/improvement of the patient's blood sugar during their course here today. Patient was instructed to follow up with primary physician in the near future for a recheck and to continue their outpatient medications. The patient understands that at this time there is no evidence for a more malignant underlying process, but the patient also understands that early in the process of an illness, an initial workup can be falsely reassuring.  Routine discharge counseling was given to the patient and the patient understands that worsening, changing or persistent symptoms should prompt an immediate call or follow up with their primary physician or return for reevaluation.  The importance of appropriate follow up was also discussed with the patient. More extensive discharge instructions were given in the patient's discharge paperwork.   Amount and/or Complexity of Data Reviewed Independent Historian:     Details: family Labs: ordered. Decision-making details documented in ED Course. Radiology: ordered and independent interpretation performed. Decision-making details documented in ED Course. ECG/medicine tests: ordered and independent interpretation performed. Decision-making details documented in ED Course.  Risk Prescription drug management. Parenteral controlled substances. Decision regarding hospitalization.     Diagnosis:  1. Hyperglycemia due to diabetes mellitus    (CMS-HCC)   2. Adverse effect of drug,  initial encounter   3. Acute cystitis without hematuria    Condition: Stable Disposition: Discharge   This chart has been completed using Engineer, civil (consulting)  software, and while attempts have been made to ensure accuracy, certain words and phrases may not be transcribed as intended.     Moody Mar, DO 03/14/24 1740

## 2024-03-20 DIAGNOSIS — E1122 Type 2 diabetes mellitus with diabetic chronic kidney disease: Secondary | ICD-10-CM | POA: Diagnosis not present

## 2024-03-20 DIAGNOSIS — N1832 Chronic kidney disease, stage 3b: Secondary | ICD-10-CM | POA: Diagnosis not present

## 2024-03-21 NOTE — Telephone Encounter (Signed)
 Patient's daughter Grayce Gunnels is calling and requesting to make an appointment for the referral for memory loss  Please contact Sonya at [phone]

## 2024-03-22 NOTE — Telephone Encounter (Signed)
 Returned her call, we spoke, I mailed packet and she is aware she must return it for an appt.Erin Hodgin [employer]

## 2024-03-25 ENCOUNTER — Encounter (HOSPITAL_COMMUNITY): Payer: Self-pay

## 2024-03-25 ENCOUNTER — Emergency Department (HOSPITAL_COMMUNITY)

## 2024-03-25 ENCOUNTER — Other Ambulatory Visit: Payer: Self-pay

## 2024-03-25 ENCOUNTER — Inpatient Hospital Stay (HOSPITAL_COMMUNITY)
Admission: EM | Admit: 2024-03-25 | Discharge: 2024-03-28 | DRG: 065 | Disposition: A | Discharge status: Home / Self Care | Attending: Internal Medicine | Admitting: Internal Medicine

## 2024-03-25 DIAGNOSIS — R4182 Altered mental status, unspecified: Secondary | ICD-10-CM | POA: Diagnosis not present

## 2024-03-25 DIAGNOSIS — R569 Unspecified convulsions: Secondary | ICD-10-CM | POA: Diagnosis not present

## 2024-03-25 DIAGNOSIS — R29702 NIHSS score 2: Secondary | ICD-10-CM | POA: Diagnosis present

## 2024-03-25 DIAGNOSIS — Z7982 Long term (current) use of aspirin: Secondary | ICD-10-CM

## 2024-03-25 DIAGNOSIS — Z8673 Personal history of transient ischemic attack (TIA), and cerebral infarction without residual deficits: Secondary | ICD-10-CM

## 2024-03-25 DIAGNOSIS — I129 Hypertensive chronic kidney disease with stage 1 through stage 4 chronic kidney disease, or unspecified chronic kidney disease: Secondary | ICD-10-CM | POA: Diagnosis present

## 2024-03-25 DIAGNOSIS — N1832 Chronic kidney disease, stage 3b: Secondary | ICD-10-CM | POA: Diagnosis not present

## 2024-03-25 DIAGNOSIS — E66811 Obesity, class 1: Secondary | ICD-10-CM | POA: Diagnosis present

## 2024-03-25 DIAGNOSIS — I639 Cerebral infarction, unspecified: Secondary | ICD-10-CM

## 2024-03-25 DIAGNOSIS — I10 Essential (primary) hypertension: Secondary | ICD-10-CM | POA: Diagnosis not present

## 2024-03-25 DIAGNOSIS — G9349 Other encephalopathy: Secondary | ICD-10-CM | POA: Diagnosis present

## 2024-03-25 DIAGNOSIS — R413 Other amnesia: Secondary | ICD-10-CM | POA: Diagnosis present

## 2024-03-25 DIAGNOSIS — Z7902 Long term (current) use of antithrombotics/antiplatelets: Secondary | ICD-10-CM | POA: Diagnosis not present

## 2024-03-25 DIAGNOSIS — K219 Gastro-esophageal reflux disease without esophagitis: Secondary | ICD-10-CM | POA: Diagnosis present

## 2024-03-25 DIAGNOSIS — Z794 Long term (current) use of insulin: Secondary | ICD-10-CM

## 2024-03-25 DIAGNOSIS — E782 Mixed hyperlipidemia: Secondary | ICD-10-CM | POA: Diagnosis present

## 2024-03-25 DIAGNOSIS — R410 Disorientation, unspecified: Secondary | ICD-10-CM | POA: Diagnosis not present

## 2024-03-25 DIAGNOSIS — I6381 Other cerebral infarction due to occlusion or stenosis of small artery: Secondary | ICD-10-CM | POA: Diagnosis not present

## 2024-03-25 DIAGNOSIS — I69349 Monoplegia of lower limb following cerebral infarction affecting unspecified side: Secondary | ICD-10-CM | POA: Diagnosis not present

## 2024-03-25 DIAGNOSIS — Z6834 Body mass index (BMI) 34.0-34.9, adult: Secondary | ICD-10-CM | POA: Diagnosis not present

## 2024-03-25 DIAGNOSIS — I6389 Other cerebral infarction: Secondary | ICD-10-CM | POA: Diagnosis not present

## 2024-03-25 DIAGNOSIS — E119 Type 2 diabetes mellitus without complications: Secondary | ICD-10-CM | POA: Diagnosis not present

## 2024-03-25 DIAGNOSIS — E669 Obesity, unspecified: Secondary | ICD-10-CM | POA: Diagnosis not present

## 2024-03-25 DIAGNOSIS — D320 Benign neoplasm of cerebral meninges: Secondary | ICD-10-CM | POA: Diagnosis present

## 2024-03-25 DIAGNOSIS — E1122 Type 2 diabetes mellitus with diabetic chronic kidney disease: Secondary | ICD-10-CM | POA: Diagnosis not present

## 2024-03-25 DIAGNOSIS — N182 Chronic kidney disease, stage 2 (mild): Secondary | ICD-10-CM | POA: Diagnosis not present

## 2024-03-25 DIAGNOSIS — Z87440 Personal history of urinary (tract) infections: Secondary | ICD-10-CM

## 2024-03-25 DIAGNOSIS — Z79899 Other long term (current) drug therapy: Secondary | ICD-10-CM | POA: Diagnosis not present

## 2024-03-25 DIAGNOSIS — N179 Acute kidney failure, unspecified: Secondary | ICD-10-CM | POA: Diagnosis not present

## 2024-03-25 LAB — CBC WITH DIFFERENTIAL/PLATELET
Abs Immature Granulocytes: 0.03 K/uL (ref 0.00–0.07)
Basophils Absolute: 0.1 K/uL (ref 0.0–0.1)
Basophils Relative: 1 %
Eosinophils Absolute: 0.2 K/uL (ref 0.0–0.5)
Eosinophils Relative: 2 %
HCT: 37.5 % (ref 36.0–46.0)
Hemoglobin: 12.1 g/dL (ref 12.0–15.0)
Immature Granulocytes: 0 %
Lymphocytes Relative: 43 %
Lymphs Abs: 4.2 K/uL — ABNORMAL HIGH (ref 0.7–4.0)
MCH: 30.5 pg (ref 26.0–34.0)
MCHC: 32.3 g/dL (ref 30.0–36.0)
MCV: 94.5 fL (ref 80.0–100.0)
Monocytes Absolute: 0.5 K/uL (ref 0.1–1.0)
Monocytes Relative: 5 %
Neutro Abs: 4.9 K/uL (ref 1.7–7.7)
Neutrophils Relative %: 49 %
Platelets: 308 K/uL (ref 150–400)
RBC: 3.97 MIL/uL (ref 3.87–5.11)
RDW: 14.7 % (ref 11.5–15.5)
WBC: 9.8 K/uL (ref 4.0–10.5)
nRBC: 0 % (ref 0.0–0.2)

## 2024-03-25 LAB — URINALYSIS, ROUTINE W REFLEX MICROSCOPIC
Bilirubin Urine: NEGATIVE
Glucose, UA: >=500 mg/dL — AB
Hgb urine dipstick: NEGATIVE
Ketones, ur: NEGATIVE mg/dL
Leukocytes,Ua: NEGATIVE
Nitrite: NEGATIVE
Protein, ur: 100 mg/dL — AB
Specific Gravity, Urine: 1.025 (ref 1.005–1.030)
pH: 5 (ref 5.0–8.0)

## 2024-03-25 LAB — COMPREHENSIVE METABOLIC PANEL WITH GFR
ALT: 12 U/L (ref 0–44)
AST: 16 U/L (ref 15–41)
Albumin: 4 g/dL (ref 3.5–5.0)
Alkaline Phosphatase: 63 U/L (ref 38–126)
Anion gap: 12 (ref 5–15)
BUN: 18 mg/dL (ref 8–23)
CO2: 22 mmol/L (ref 22–32)
Calcium: 9.8 mg/dL (ref 8.9–10.3)
Chloride: 107 mmol/L (ref 98–111)
Creatinine, Ser: 1.71 mg/dL — ABNORMAL HIGH (ref 0.44–1.00)
GFR, Estimated: 31 mL/min — ABNORMAL LOW (ref >60)
Glucose, Bld: 73 mg/dL (ref 70–99)
Potassium: 4.3 mmol/L (ref 3.5–5.1)
Sodium: 142 mmol/L (ref 135–145)
Total Bilirubin: 0.3 mg/dL (ref 0.0–1.2)
Total Protein: 7.5 g/dL (ref 6.5–8.1)

## 2024-03-25 LAB — URINALYSIS, W/ REFLEX TO CULTURE (INFECTION SUSPECTED)
Bilirubin Urine: NEGATIVE
Glucose, UA: >=500 mg/dL — AB
Hgb urine dipstick: NEGATIVE
Ketones, ur: NEGATIVE mg/dL
Nitrite: NEGATIVE
Protein, ur: 100 mg/dL — AB
Specific Gravity, Urine: 1.026 (ref 1.005–1.030)
WBC, UA: >50 WBC/hpf (ref 0–5)
pH: 5 (ref 5.0–8.0)

## 2024-03-25 LAB — FOLATE: Folate: 13.2 ng/mL (ref >5.9)

## 2024-03-25 LAB — TSH: TSH: 1.06 u[IU]/mL (ref 0.350–4.500)

## 2024-03-25 LAB — VITAMIN B12: Vitamin B-12: 974 pg/mL — ABNORMAL HIGH (ref 180–914)

## 2024-03-25 LAB — GLUCOSE, CAPILLARY
Glucose-Capillary: 113 mg/dL — ABNORMAL HIGH (ref 70–99)
Glucose-Capillary: 141 mg/dL — ABNORMAL HIGH (ref 70–99)

## 2024-03-25 MED ORDER — SODIUM CHLORIDE 0.9 % IV SOLN
2.0000 g | INTRAVENOUS | Status: DC
  Administered 2024-03-25 – 2024-03-27 (×3): 2 g via INTRAVENOUS
  Filled 2024-03-25 (×3): qty 20

## 2024-03-25 MED ORDER — BUDESON-GLYCOPYRROL-FORMOTEROL 160-9-4.8 MCG/ACT IN AERO: 2.0000 | INHALATION_SPRAY | Freq: Two times a day (BID) | RESPIRATORY_TRACT | Status: DC

## 2024-03-25 MED ORDER — ASPIRIN 81 MG PO TBEC
162.0000 mg | DELAYED_RELEASE_TABLET | Freq: Every day | ORAL | Status: DC
  Administered 2024-03-26 – 2024-03-27 (×2): 162 mg via ORAL
  Filled 2024-03-25 (×3): qty 2

## 2024-03-25 MED ORDER — INSULIN GLARGINE-YFGN 100 UNIT/ML ~~LOC~~ SOLN
25.0000 [IU] | Freq: Every day | SUBCUTANEOUS | Status: DC
Start: 2024-03-25 — End: 2024-03-25
  Filled 2024-03-25: qty 0.25

## 2024-03-25 MED ORDER — INSULIN ASPART 100 UNIT/ML IJ SOLN
0.0000 [IU] | Freq: Three times a day (TID) | INTRAMUSCULAR | Status: DC
  Administered 2024-03-26: 3 [IU] via SUBCUTANEOUS
  Administered 2024-03-26: 5 [IU] via SUBCUTANEOUS
  Administered 2024-03-27: 2 [IU] via SUBCUTANEOUS
  Administered 2024-03-27 (×2): 3 [IU] via SUBCUTANEOUS

## 2024-03-25 MED ORDER — ONDANSETRON HCL 4 MG/2ML IJ SOLN: 4.0000 mg | Freq: Four times a day (QID) | INTRAMUSCULAR | Status: DC | PRN

## 2024-03-25 MED ORDER — ALBUTEROL SULFATE (2.5 MG/3ML) 0.083% IN NEBU: 2.5000 mg | INHALATION_SOLUTION | Freq: Four times a day (QID) | RESPIRATORY_TRACT | Status: DC | PRN

## 2024-03-25 MED ORDER — INSULIN ASPART 100 UNIT/ML FLEXPEN
10.0000 [IU] | PEN_INJECTOR | Freq: Three times a day (TID) | SUBCUTANEOUS | Status: DC
  Filled 2024-03-25: qty 3

## 2024-03-25 MED ORDER — INSULIN GLARGINE 100 UNIT/ML ~~LOC~~ SOLN
25.0000 [IU] | Freq: Every day | SUBCUTANEOUS | Status: DC
  Administered 2024-03-26 – 2024-03-27 (×2): 25 [IU] via SUBCUTANEOUS
  Filled 2024-03-25 (×4): qty 0.25

## 2024-03-25 MED ORDER — ENOXAPARIN SODIUM 40 MG/0.4ML IJ SOSY
40.0000 mg | PREFILLED_SYRINGE | INTRAMUSCULAR | Status: DC
  Filled 2024-03-25: qty 0.4

## 2024-03-25 MED ORDER — ONDANSETRON HCL 4 MG PO TABS: 4.0000 mg | ORAL_TABLET | Freq: Four times a day (QID) | ORAL | Status: DC | PRN

## 2024-03-25 MED ORDER — INSULIN ASPART 100 UNIT/ML IJ SOLN
0.0000 [IU] | Freq: Every day | INTRAMUSCULAR | Status: DC
  Administered 2024-03-26: 2 [IU] via SUBCUTANEOUS

## 2024-03-25 MED ORDER — CARVEDILOL 12.5 MG PO TABS
25.0000 mg | ORAL_TABLET | Freq: Two times a day (BID) | ORAL | Status: DC
  Administered 2024-03-26: 25 mg via ORAL
  Filled 2024-03-25: qty 2

## 2024-03-25 MED ORDER — IRBESARTAN 150 MG PO TABS
300.0000 mg | ORAL_TABLET | Freq: Every day | ORAL | Status: DC
  Administered 2024-03-26: 300 mg via ORAL
  Filled 2024-03-25: qty 2

## 2024-03-25 MED ORDER — PANTOPRAZOLE SODIUM 40 MG PO TBEC
40.0000 mg | DELAYED_RELEASE_TABLET | Freq: Two times a day (BID) | ORAL | Status: DC
  Administered 2024-03-25 – 2024-03-28 (×6): 40 mg via ORAL
  Filled 2024-03-25 (×6): qty 1

## 2024-03-25 NOTE — ED Provider Notes (Signed)
 [place] EMERGENCY DEPARTMENT AT [facility] Provider Note   CSN: [id number] Arrival date & time: 03/25/24  1209     Patient presents with: Memory Loss   Katherine Stokes is a 73 y.o. female with a history occluding type 2 diabetes, hyperlipidemia, GERD, chronic kidney disease, hypertension and history of CVA with resolved right sided weakness presenting with a 1 month history of increasing confusion and memory issues.  Daughter and patient's sister is at the bedside and gives a fair amount of her history.  She was treated for UTI starting September 16 at [facility] ER at which time daughter had noticed confusion and memory issues, for example could not remember whether she had taken her medications, at times  did not recognize her daughter, also with some hallucinations such as talking to her deceased parents.  She was treated for the UTI but her confusion has worsened.  She has had poor p.o. intake and continues to intermittently get confused regarding identify of family.   Prior to this UTI treatment she was living independently by herself, but does not feel she is safe in the situation.  Review of workup from [facility] ER including negative TSH, vitamin B12 level was also collected and was normal range.  She endorses that she just does not feel well but cannot describe specific symptoms.   The history is provided by the patient and a relative (sister and daughter at bedside).       Prior to Admission medications   Medication Sig Start Date End Date Taking? Authorizing Provider  albuterol  (PROVENTIL ) (2.5 MG/3ML) 0.083% nebulizer solution Take 2.5 mg by nebulization every 6 (six) hours as needed for wheezing or shortness of breath.    [provider]  albuterol  (VENTOLIN  HFA) 108 (90 Base) MCG/ACT inhaler Inhale 2 puffs into the lungs every 6 (six) hours as needed for wheezing or shortness of breath. 05/06/21   Sood, Vineet, MD  allopurinol (ZYLOPRIM) 300 MG tablet Take 300 mg by  mouth daily.    [provider]  amLODipine  (NORVASC ) 5 MG tablet Take 5 mg by mouth daily.    [provider]  aspirin  EC 81 MG tablet Take 162 mg by mouth daily. Swallow whole.    [provider]  carvedilol  (COREG ) 25 MG tablet Take 25 mg by mouth 2 (two) times daily with a meal.    [provider]  cholecalciferol (VITAMIN D) 25 MCG (1000 UNIT) tablet Take 1,000 Units by mouth daily.    [provider]  dapagliflozin propanediol (FARXIGA) 5 MG TABS tablet Take 5 mg by mouth daily.    [provider]  diclofenac Sodium (VOLTAREN) 1 % GEL Apply 2 g topically 4 (four) times daily as needed (pain).    [provider]  ferrous sulfate  325 (65 FE) MG tablet Take 1 tablet (325 mg total) by mouth daily. 04/05/21 04/05/22  ShahmehdiAdriana LABOR, MD  Fluticasone -Umeclidin-Vilant (TRELEGY ELLIPTA) 100-62.5-25 MCG/INH AEPB Inhale 1 puff into the lungs daily as needed (shortness of breath).    [provider]  gabapentin (NEURONTIN) 300 MG capsule Take 300 mg by mouth 2 (two) times daily as needed for pain. 04/08/21   [provider]  insulin  aspart (NOVOLOG  FLEXPEN) 100 UNIT/ML FlexPen Inject 25 Units into the skin 3 (three) times daily with meals.    [provider]  insulin  glargine (LANTUS SOLOSTAR) 100 UNIT/ML Solostar Pen Inject 40 Units into the skin at bedtime.    [provider]  latanoprost (XALATAN) 0.005 % ophthalmic solution Place 1 drop into both eyes at bedtime. 02/08/21   [provider]  Omega-3 Fatty Acids (FISH OIL) 1000 MG CAPS Take 1,000 mg by mouth 3 (three) times daily.    [provider]  pantoprazole  (PROTONIX ) 40 MG tablet Take 1 tablet (40 mg total) by mouth 2 (two) times daily. 05/20/21 06/19/21  Carlan, Mitzie CROME, NP  rosuvastatin  (CRESTOR ) 40 MG tablet Take 40 mg by mouth daily. 03/27/21   [provider]  sodium bicarbonate  650 MG tablet Take 650 mg by mouth 3  (three) times daily.    [provider]  vitamin B-12 (CYANOCOBALAMIN) 500 MCG tablet Take 500 mcg by mouth daily.    [provider]    Allergies: Penicillins and Sulfa antibiotics    Review of Systems  Unable to perform ROS: Mental status change    Updated Vital Signs BP 118/61 (BP Location: Right Arm)   Pulse 71   Temp 98.3 F (36.8 C) (Oral)   Resp 18   Ht 5' 3 (1.6 m)   Wt 87.1 kg   SpO2 95%   BMI 34.01 kg/m   Physical Exam Vitals and nursing note reviewed.  Constitutional:      General: She is not in acute distress.    Appearance: She is well-developed.  HENT:     Head: Normocephalic and atraumatic.  Eyes:     Conjunctiva/sclera: Conjunctivae normal.  Cardiovascular:     Rate and Rhythm: Normal rate and regular rhythm.     Heart sounds: Normal heart sounds.  Pulmonary:     Effort: Pulmonary effort is normal.     Breath sounds: Normal breath sounds. No wheezing.  Abdominal:     General: Bowel sounds are normal.     Palpations: Abdomen is soft.     Tenderness: There is no abdominal tenderness.  Musculoskeletal:        General: Normal range of motion.     Cervical back: Normal range of motion.  Skin:    General: Skin is warm and dry.  Neurological:     General: No focal deficit present.     Mental Status: She is alert.     Cranial Nerves: Cranial nerves 2-12 are intact.     Sensory: Sensation is intact.     Comments: Oriented to person and place.       (all labs ordered are listed, but only abnormal results are displayed) Labs Reviewed  URINALYSIS, W/ REFLEX TO CULTURE (INFECTION SUSPECTED) - Abnormal; Notable for the following components:      Result Value   APPearance CLOUDY (*)    Glucose, UA >=500 (*)    Protein, ur 100 (*)    Leukocytes,Ua LARGE (*)    Bacteria, UA RARE (*)    All other components within normal limits  COMPREHENSIVE METABOLIC PANEL WITH GFR - Abnormal; Notable for the following components:   Creatinine, Ser  1.71 (*)    GFR, Estimated 31 (*)    All other components within normal limits  CBC WITH DIFFERENTIAL/PLATELET - Abnormal; Notable for the following components:   Lymphs Abs 4.2 (*)    All other components within normal limits  URINALYSIS, ROUTINE W REFLEX MICROSCOPIC - Abnormal; Notable for the following components:   APPearance HAZY (*)    Glucose, UA >=500 (*)    Protein, ur 100 (*)    Bacteria, UA RARE (*)    All other components within normal limits  EKG: None  Radiology: CT Head Wo Contrast Result Date: 03/25/2024 CLINICAL DATA:  Mental status change, persistent or worsening EXAM: CT HEAD WITHOUT CONTRAST TECHNIQUE: Contiguous axial images were obtained from the base of the skull through the vertex without intravenous contrast. RADIATION DOSE REDUCTION: This exam was performed according to the departmental dose-optimization program which includes automated exposure control, adjustment of the mA and/or kV according to patient size and/or use of iterative reconstruction technique. COMPARISON:  Head CT 08/18/2023 FINDINGS: Brain: No acute intracranial hemorrhage. Stable size and appearance of calcified mass along the medial aspect of the right tentorium measuring 1.4 x 0.8 x 1.3 cm, series 2, image 9. This again abuts the pons without definite associated edema on the current exam. 5 mm dural calcification versus small calcified meningioma in the left paraclinoid region, series 2, image 7, unchanged. No associated mass effect. No evidence of acute ischemia, subdural or extra-axial collection. Stable degree of atrophy and chronic small vessel ischemia. Grossly stable small lacunar infarcts in the bilateral basal ganglia. Vascular: Atherosclerosis of skullbase vasculature without hyperdense vessel or abnormal calcification. Skull: No fracture or focal lesion. Sinuses/Orbits: No acute finding. Other: None. IMPRESSION: 1. No acute intracranial abnormality. 2. Stable right  tentorial/cerebellopontine angle meningioma. 3. Stable atrophy and chronic vessel ischemia. Electronically Signed   By: Andrea Gasman M.D.   On: 03/25/2024 15:30     Procedures   Medications Ordered in the ED - No data to display                                  Medical Decision Making Patient presenting with altered mental status with intermittent confusion, significant memory loss, not recognizing family members and hallucinating about deceased family members over the past 2 to 3 weeks, an abrupt change since prior to a UTI that she had mid September.  Symptoms are of unclear etiology at this time, she does not have a persistent UTI, no significant electrolyte abnormalities, CT head negative for acute findings.    Amount and/or Complexity of Data Reviewed Labs: ordered.    Details: Cbc, cmet,  urinalysis reviewed.  Creatinine 1.71,  stable.  Her initial urinalysis was not a good clean-catch repeated as an In-N-Out cath and no UTI is present. Radiology: ordered.    Details: CT head negative for acute CVA or other source of AMS. Discussion of management or test interpretation with external provider(s): Call placed to the hospitalist to discuss admission due to altered mental status of unclear etiology.  Pt discussed with Dr. Barbra who will see pt in ed.  Risk Decision regarding hospitalization.        Final diagnoses:  Altered mental status, unspecified altered mental status type    ED Discharge Orders     None          Birdena Mliss RIGGERS 03/25/24 1658    Freddi Hamilton, MD 03/26/24 [phone]

## 2024-03-25 NOTE — H&P (Signed)
 History and Physical    Patient: Katherine Stokes DOB: 01/16/1951 DOA: 03/25/2024 DOS: the patient was seen and examined on 03/25/2024 PCP: Orpha Yancey LABOR, MD  Patient coming from: Home  Chief Complaint:  Chief Complaint  Patient presents with   Memory Loss   HPI: Katherine Stokes is a 73 y.o. female with medical history significant of hypertension, diabetes type 2, history of stroke with residual right leg weakness, stage II chronic kidney disease.  Patient brought in by family due to confusion over the last 3 weeks.  She went to [facility] couple weeks ago for her confusion and was diagnosed with a UTI.  She was started on Keflex, but really has not improved.  Prior to this, the patient was fully independent and functioning well on her own.  Her memory was completely intact.  Now, she has difficulty remembering her daughter's name as well as the names of other people who she knew well prior to this.  In addition, she is unable to cook for herself, balancing her checkbook, feed herself, do her insulin , check her blood sugars - all of which she did prior to 2-3 weeks ago.  No new medicines.  She has been off the Keflex for over a week now.  She has not had any improvement since stopping the Keflex.  Denies fevers, chills, nausea, vomiting.  No recent trauma, falls, etc.  She does have frequency and does get frequent UTIs.  She did have an episode of UTI a couple years ago where she had profound confusion similar to this.  Review of Systems: As mentioned in the history of present illness. All other systems reviewed and are negative. Past Medical History:  Diagnosis Date   Asthma    Chronic kidney disease (CKD), stage II (mild)    Essential hypertension    GERD (gastroesophageal reflux disease)    History of stroke    Residual right leg weakness   Mixed hyperlipidemia    Stroke (HCC)    Type 2 diabetes mellitus (HCC)    Past Surgical History:  Procedure Laterality Date    ABDOMINAL HYSTERECTOMY     COLONOSCOPY WITH PROPOFOL  N/A 06/04/2021   Procedure: COLONOSCOPY WITH PROPOFOL ;  Surgeon: Eartha Angelia Sieving, MD;  [facility];  Service: Gastroenterology;  Laterality: N/A;  12:15   ESOPHAGEAL DILATION N/A 04/04/2021   Procedure: ESOPHAGEAL DILATION;  Surgeon: Eartha Angelia Sieving, MD;  Location: [facility];  Service: Gastroenterology;  Laterality: N/A;   ESOPHAGOGASTRODUODENOSCOPY (EGD) WITH PROPOFOL  N/A 04/04/2021   Procedure: ESOPHAGOGASTRODUODENOSCOPY (EGD) WITH PROPOFOL ;  Surgeon: Eartha Angelia Sieving, MD;  Location: [facility];  Service: Gastroenterology;  Laterality: N/A;   POLYPECTOMY  04/04/2021   Procedure: POLYPECTOMY;  Surgeon: Eartha Angelia Sieving, MD;  Location: [facility];  Service: Gastroenterology;;   POLYPECTOMY  06/04/2021   Procedure: POLYPECTOMY;  Surgeon: Eartha Angelia, Sieving, MD;  Location: [facility];  Service: Gastroenterology;;   TONSILLECTOMY     Social History:  reports that she has never smoked. She has never used smokeless tobacco. She reports that she does not drink alcohol and does not use drugs.  Allergies  Allergen Reactions   Penicillins Swelling and Rash   Sulfa Antibiotics Rash    Family History  Problem Relation Age of Onset   Dementia Mother    Colon cancer Father        age greater than 60    Prior to Admission medications   Medication Sig  Start Date End Date Taking? Authorizing Provider  albuterol  (PROVENTIL ) (2.5 MG/3ML) 0.083% nebulizer solution Take 2.5 mg by nebulization every 6 (six) hours as needed for wheezing or shortness of breath.   Yes [provider]  albuterol  (VENTOLIN  HFA) 108 (90 Base) MCG/ACT inhaler Inhale 2 puffs into the lungs every 6 (six) hours as needed for wheezing or shortness of breath. 05/06/21  Yes Sood, Vineet, MD  allopurinol (ZYLOPRIM) 300 MG tablet Take 300 mg by mouth daily.   Yes [provider]  aspirin  EC 81  MG tablet Take 162 mg by mouth daily. Swallow whole.   Yes [provider]  carvedilol  (COREG ) 25 MG tablet Take 25 mg by mouth 2 (two) times daily with a meal.   Yes [provider]  cholecalciferol (VITAMIN D) 25 MCG (1000 UNIT) tablet Take 1,000 Units by mouth daily.   Yes [provider]  dapagliflozin propanediol (FARXIGA) 5 MG TABS tablet Take 5 mg by mouth daily.   Yes [provider]  Fluticasone -Umeclidin-Vilant (TRELEGY ELLIPTA) 100-62.5-25 MCG/INH AEPB Inhale 1 puff into the lungs daily as needed (shortness of breath).   Yes [provider]  insulin  aspart (NOVOLOG  FLEXPEN) 100 UNIT/ML FlexPen Inject 10 Units into the skin 3 (three) times daily with meals.   Yes [provider]  insulin  glargine (LANTUS SOLOSTAR) 100 UNIT/ML Solostar Pen Inject 25 Units into the skin at bedtime.   Yes [provider]  olmesartan (BENICAR) 40 MG tablet Take 40 mg by mouth daily. 08/12/23  Yes [provider]  Omega-3 Fatty Acids (FISH OIL) 1000 MG CAPS Take 1,000 mg by mouth 3 (three) times daily.   Yes [provider]  pantoprazole  (PROTONIX ) 40 MG tablet Take 1 tablet (40 mg total) by mouth 2 (two) times daily. 05/20/21 03/25/24 Yes Carlan, Chelsea L, NP  vitamin B-12 (CYANOCOBALAMIN) 500 MCG tablet Take 500 mcg by mouth daily.   Yes [provider]    Physical Exam: Vitals:   03/25/24 1600 03/25/24 1615 03/25/24 1645 03/25/24 1652  BP:    118/61  Pulse: 68 67 72 71  Resp:    18  Temp:    98.3 F (36.8 C)  TempSrc:    Oral  SpO2: 98% 99% 92% 95%  Weight:      Height:       General: Elderly female. Awake and alert and oriented x2. No acute cardiopulmonary distress.  HEENT: Normocephalic atraumatic.  Right and left ears normal in appearance.  Pupils equal, round, reactive to light. Extraocular muscles are intact. Sclerae anicteric and noninjected.  Moist mucosal membranes. No mucosal lesions.  Neck: Neck  supple and without nuchal rigidity.  Without lymphadenopathy. No carotid bruits. No masses palpated.  Cardiovascular: Regular rate with normal S1-S2 sounds. No murmurs, rubs, gallops auscultated. No JVD.  Respiratory: Good respiratory effort with no wheezes, rales, rhonchi. Lungs clear to auscultation bilaterally.  No accessory muscle use. Abdomen: Soft, nontender, nondistended. Active bowel sounds. No masses or hepatosplenomegaly  Skin: No rashes, lesions, or ulcerations.  Dry, warm to touch. 2+ dorsalis pedis and radial pulses. Musculoskeletal: No calf or leg pain. All major joints not erythematous nontender.  No upper or lower joint deformation.  Good ROM.  No contractures  Psychiatric: Patient recognizes that she is confused Neurologic: No focal neurological deficits.  Strength is 5/5 and symmetric in upper and lower extremities.  Cranial nerves II through XII are grossly intact.   Data Reviewed: All labs and images reviewed  by me  Assessment and Plan: No notes have been filed under this hospital service. Service: Hospitalist  Principal Problem:   Confusion Active Problems:   MORBID OBESITY   Essential hypertension   History of stroke   DM II (diabetes mellitus, type II), controlled (HCC)   Chronic kidney disease (CKD), stage II (mild)  Confusion Uncertain of etiology.  There is some suggestion of a UTI with large leuks in one of her urine samples with rare bacteria.  Will start Rocephin Urine culture Check TSH, vitamin B12, folate. Repeat labs in the morning Check MRI brain tomorrow If workup appears relatively normal, may need neurology consult History of stroke  Diabetes Hold Farxiga.  If this is a UTI, Doreen may be causing her to have recurrent episodes due to its mechanism of action.  If so, may need alternative medicine Hypertension Continue home meds Stage III chronic kidney disease At baseline   Advance Care Planning:   Code Status: Full Code confirmed by  patient's family  Consults:   Family Communication: Daughter and sister present  Severity of Illness: The appropriate patient status for this patient is INPATIENT. Inpatient status is judged to be reasonable and necessary in order to provide the required intensity of service to ensure the patient's safety. The patient's presenting symptoms, physical exam findings, and initial radiographic and laboratory data in the context of their chronic comorbidities is felt to place them at high risk for further clinical deterioration. Furthermore, it is not anticipated that the patient will be medically stable for discharge from the hospital within 2 midnights of admission.   * I certify that at the point of admission it is my clinical judgment that the patient will require inpatient hospital care spanning beyond 2 midnights from the point of admission due to high intensity of service, high risk for further deterioration and high frequency of surveillance required.*  Author: Sven Pinheiro J Valisha Heslin, DO 03/25/2024 5:45 PM  For on call review [email or web address].

## 2024-03-25 NOTE — ED Triage Notes (Signed)
 Pt daughter reports pt with memory loss and UTI x 1 month.  Pt took abx for UTI but memory has not improved.

## 2024-03-25 NOTE — ED Notes (Signed)
 Juice given to pt to prevent sugar from becoming lower than current 73.

## 2024-03-26 ENCOUNTER — Inpatient Hospital Stay (HOSPITAL_COMMUNITY)

## 2024-03-26 DIAGNOSIS — I10 Essential (primary) hypertension: Secondary | ICD-10-CM

## 2024-03-26 DIAGNOSIS — E119 Type 2 diabetes mellitus without complications: Secondary | ICD-10-CM

## 2024-03-26 DIAGNOSIS — R410 Disorientation, unspecified: Secondary | ICD-10-CM | POA: Diagnosis not present

## 2024-03-26 DIAGNOSIS — N182 Chronic kidney disease, stage 2 (mild): Secondary | ICD-10-CM

## 2024-03-26 DIAGNOSIS — I639 Cerebral infarction, unspecified: Secondary | ICD-10-CM | POA: Diagnosis not present

## 2024-03-26 DIAGNOSIS — Z8673 Personal history of transient ischemic attack (TIA), and cerebral infarction without residual deficits: Secondary | ICD-10-CM

## 2024-03-26 LAB — GLUCOSE, CAPILLARY
Glucose-Capillary: 134 mg/dL — ABNORMAL HIGH (ref 70–99)
Glucose-Capillary: 211 mg/dL — ABNORMAL HIGH (ref 70–99)
Glucose-Capillary: 118 mg/dL — ABNORMAL HIGH (ref 70–99)
Glucose-Capillary: 118 mg/dL — ABNORMAL HIGH (ref 70–99)
Glucose-Capillary: 203 mg/dL — ABNORMAL HIGH (ref 70–99)

## 2024-03-26 LAB — LIPID PANEL
Cholesterol: 181 mg/dL (ref 0–200)
HDL: 22 mg/dL — ABNORMAL LOW (ref >40)
LDL Cholesterol: 115 mg/dL — ABNORMAL HIGH (ref 0–99)
Total CHOL/HDL Ratio: 8.3 ratio
Triglycerides: 224 mg/dL — ABNORMAL HIGH (ref <150)
VLDL: 45 mg/dL — ABNORMAL HIGH (ref 0–40)

## 2024-03-26 LAB — HEMOGLOBIN A1C
Hgb A1c MFr Bld: 7.6 % — ABNORMAL HIGH (ref 4.8–5.6)
Mean Plasma Glucose: 171.42 mg/dL

## 2024-03-26 MED ORDER — ATORVASTATIN CALCIUM 40 MG PO TABS
80.0000 mg | ORAL_TABLET | Freq: Every day | ORAL | Status: DC
  Administered 2024-03-26 – 2024-03-28 (×3): 80 mg via ORAL
  Filled 2024-03-26 (×2): qty 2
  Filled 2024-03-26: qty 4

## 2024-03-26 MED ORDER — STROKE: EARLY STAGES OF RECOVERY BOOK: Freq: Once | Status: AC

## 2024-03-26 MED ORDER — INSULIN ASPART 100 UNIT/ML IJ SOLN
10.0000 [IU] | Freq: Three times a day (TID) | INTRAMUSCULAR | Status: DC
  Administered 2024-03-26: 10 [IU] via SUBCUTANEOUS

## 2024-03-26 MED ORDER — CLOPIDOGREL BISULFATE 75 MG PO TABS
75.0000 mg | ORAL_TABLET | Freq: Every day | ORAL | Status: DC
  Administered 2024-03-26 – 2024-03-28 (×3): 75 mg via ORAL
  Filled 2024-03-26 (×3): qty 1

## 2024-03-26 NOTE — Plan of Care (Signed)
 On-call neurology note  Called by Dr. Llana the plan below in detail.  Patient with stroke on MRI Not on any sort of intervention window. Can admit to the hospital and complete stroke workup. MRI head without contrast, carotid Dopplers, 2D echo, A1c, lipid panel, frequent neurochecks and therapy assessments Start on aspirin  81 if not already on aspirin  Please call Dr. Shelton for a routine consult via telemedicine tomorrow  -- Eligio Lav, MD Neurologist [facility]

## 2024-03-26 NOTE — Progress Notes (Signed)
 Progress Note   Patient: Katherine Stokes FMW:[id number] DOB: 07-28-1950 DOA: 03/25/2024     1 DOS: the patient was seen and examined on 03/26/2024   Brief hospital course: Katherine Stokes is a 73 y.o. female with medical history significant of hypertension, diabetes type 2, history of stroke with residual right leg weakness, stage II chronic kidney disease brought in by family due to confusion over the last 3 weeks.  Patient had recent UTI and was treated with Keflex.  Initial CT head unremarkable  MRI of the brain showed 8 mm acute infarct within posterior limb of right internal capsule and right periatrial white matter.  Chronic small vessel ischemic disease, unchanged 15 mm right intrarenal cerebellopontine angle meningioma.  Discussed with neurology, advised further stroke workup.  Assessment and Plan: Acute ischemic stroke- She presented with altered mental status, confusion.  She does have prior history of strokes, takes aspirin  162 mg daily.  Not on statin. MRI of the brain showed 8 mm acute infarct within right internal capsule. Discussed with neurology Dr. Rhunette stroke workup, check A1c, lipid profile, MRA head, carotid ultrasound, Echocardiogram. I will start her on Plavix 75 mg daily, atorvastatin 80 mg daily. Neurology team to see her tomorrow morning and official consult. Allow permissive hypertension.  Stopped Coreg , Avapro. PT OT evaluation.  Family requesting rehab as she lives alone. Speech and swallow evaluation if fails bedside swallow by nurse.  Hypertension: Patient's blood pressure stable. Will allow permissive hypertension, hold beta-blocker, ARB. IV hydralazine for blood pressure greater than 200s.  Type 2 diabetes mellitus: Hold Farxiga in the setting of UTI. Check A1c. Continue Accu-Cheks, sliding scale insulin .  Obesity class I with BMI 34.01- Diet, exercise and weight reduction advised.    Out of bed to chair. Incentive spirometry. Nursing  supportive care. Fall, aspiration precautions. Diet:  Diet Orders (From admission, onward)     Start     Ordered   03/26/24 1138  Diet heart healthy/carb modified Room service appropriate? Yes; Fluid consistency: Thin  Diet effective now       Question Answer Comment  Diet-HS Snack? Nothing   Room service appropriate? Yes   Fluid consistency: Thin      03/26/24 1137           DVT prophylaxis: enoxaparin (LOVENOX) injection 40 mg Start: 03/25/24 2000  Level of care: Med-Surg   Code Status: Full Code  Subjective: Patient is seen and examined today morning.  She is more alert, able to answer her name, place.  Family at bedside states that she is still confused not at her baseline.  Physical Exam: Vitals:   03/26/24 1100 03/26/24 1131 03/26/24 1230 03/26/24 1500  BP:  128/71 123/69 117/68  Pulse:  70 70 70  Resp:  17 16 16   Temp:  99 F (37.2 C) 98 F (36.7 C) 98.8 F (37.1 C)  TempSrc:  Oral Oral Oral  SpO2: 98% 96% 95% 98%  Weight:      Height:        General - Elderly African-American obese female, no apparent distress HEENT - PERRLA, EOMI, atraumatic head, non tender sinuses. Lung - Clear, basal rales, no rhonchi, wheezes. Heart - S1, S2 heard, no murmurs, rubs, trace pedal edema. Abdomen - Soft, non tender, obese, bowel sounds good Neuro - Alert, awake and oriented, non focal exam. Skin - Warm and dry.  Data Reviewed:      Latest Ref Rng & Units 03/25/2024    1:28 PM  05/20/2021    9:42 AM 04/05/2021    5:35 AM  CBC  WBC 4.0 - 10.5 K/uL 9.8  10.5  11.2   Hemoglobin 12.0 - 15.0 g/dL 87.8  89.0  8.6   Hematocrit 36.0 - 46.0 % 37.5  34.1  27.6   Platelets 150 - 400 K/uL 308  404  333       Latest Ref Rng & Units 03/25/2024    1:28 PM 04/05/2021    5:35 AM 04/04/2021    5:43 AM  BMP  Glucose 70 - 99 mg/dL 73  96  856   BUN 8 - 23 mg/dL 18  27  33   Creatinine 0.44 - 1.00 mg/dL 8.28  7.86  7.76   Sodium 135 - 145 mmol/L 142  139  138   Potassium 3.5  - 5.1 mmol/L 4.3  3.8  3.9   Chloride 98 - 111 mmol/L 107  111  110   CO2 22 - 32 mmol/L 22  22  21    Calcium  8.9 - 10.3 mg/dL 9.8  8.8  8.9    MR BRAIN WO CONTRAST Result Date: 03/26/2024 : Provided history: Mental status change, unknown cause. EXAM: MRI HEAD WITHOUT CONTRAST TECHNIQUE: Multiplanar, multiecho pulse sequences of the brain and surrounding structures were obtained without intravenous contrast. COMPARISON:  Head CT 03/25/2024.  Brain MRI 08/19/2023. FINDINGS: Brain: No age-advanced or lobar predominant cerebral atrophy. 8 mm acute infarct within the posterior limb of right internal capsule and right periatrial white matter (series 5, image 28). Unchanged chronic lacunar infarcts within/about the bilateral deep gray nuclei. Background multifocal T2 FLAIR hyperintense signal abnormality within the cerebral white matter, nonspecific but compatible with moderate chronic small vessel ischemic disease. Unchanged chronic lacunar infarcts, and background chronic small vessel ischemic disease, within the pons. Small chronic infarct within the left cerebellar hemisphere, new from the prior MRI of 08/19/2023 (series 10, image 6). 15 x 10 mm right tentorial/cerebellopontine angle mass consistent with a meningioma, unchanged from the prior MRI. As before, there is mild mass effect upon the right aspect of the pons. No underlying parenchymal edema. Several chronic microhemorrhages scattered within the supratentorial and infratentorial brain. No cortical encephalomalacia is identified. No extra-axial fluid collection. No midline shift. Vascular: Maintained flow voids within the proximal large arterial vessels. Skull and upper cervical spine: No focal worrisome marrow lesion. Sinuses/Orbits: No mass or acute finding within the imaged orbits. No significant paranasal sinus disease. Impression #1 will be called to the ordering clinician or representative by the Radiologist Assistant, and communication documented  in the PACS or [employer]. IMPRESSION: 1. 8 mm acute infarct within the posterior limb of right internal capsule and right periatrial white matter. 2. Background chronic small vessel ischemic disease and chronic infarcts, as described. 3. Unchanged 15 mm right tentorial/cerebellopontine angle meningioma. As before, there is mild mass effect upon the right aspect of the pons (without underlying parenchymal edema). Electronically Signed   By: Rockey Childs D.O.   On: 03/26/2024 10:28   CT Head Wo Contrast Result Date: 03/25/2024 CLINICAL DATA:  Mental status change, persistent or worsening EXAM: CT HEAD WITHOUT CONTRAST TECHNIQUE: Contiguous axial images were obtained from the base of the skull through the vertex without intravenous contrast. RADIATION DOSE REDUCTION: This exam was performed according to the departmental dose-optimization program which includes automated exposure control, adjustment of the mA and/or kV according to patient size and/or use of iterative reconstruction technique. COMPARISON:  Head CT 08/18/2023 FINDINGS:  Brain: No acute intracranial hemorrhage. Stable size and appearance of calcified mass along the medial aspect of the right tentorium measuring 1.4 x 0.8 x 1.3 cm, series 2, image 9. This again abuts the pons without definite associated edema on the current exam. 5 mm dural calcification versus small calcified meningioma in the left paraclinoid region, series 2, image 7, unchanged. No associated mass effect. No evidence of acute ischemia, subdural or extra-axial collection. Stable degree of atrophy and chronic small vessel ischemia. Grossly stable small lacunar infarcts in the bilateral basal ganglia. Vascular: Atherosclerosis of skullbase vasculature without hyperdense vessel or abnormal calcification. Skull: No fracture or focal lesion. Sinuses/Orbits: No acute finding. Other: None. IMPRESSION: 1. No acute intracranial abnormality. 2. Stable right tentorial/cerebellopontine  angle meningioma. 3. Stable atrophy and chronic vessel ischemia. Electronically Signed   By: Andrea Gasman M.D.   On: 03/25/2024 15:30    Family Communication: Discussed with patient, family at bedside.  They understand and agree. All questions answered.  Disposition: Status is: Inpatient Remains inpatient appropriate because: Acute stroke workup, neurology evaluation  Planned Discharge Destination: Rehab     Time spent: 46 minutes  Author: Concepcion Riser, MD 03/26/2024 3:34 PM Secure chat 7am to 7pm For on call review [email or web address].

## 2024-03-26 NOTE — Progress Notes (Signed)
 Patients MRI result come back with an 8 mm acute infarct within the posterior limb of right internal capsule and right periatrial white matter. MD Sreeram made aware.

## 2024-03-26 NOTE — TOC CM/SW Note (Signed)
 Transition of Care [facility]) - Inpatient Brief Assessment   Patient Details  Name: Katherine Stokes MRN: [id number] Date of Birth: Apr 12, 1951  Transition of Care [facility]) CM/SW Contact:    Lucie Lunger, LCSWA Phone Number: 03/26/2024, 10:25 AM  Clinical Narrative: Transition of Care Department [facility]) has reviewed patient and no TOC needs have been identified at this time. We will continue to monitor patient advancement through interdiciplinary progression rounds. If new patient transition needs arise, please place a TOC consult.  Transition of Care Asessment: Insurance and Status: Insurance coverage has been reviewed Patient has primary care physician: Yes Home environment has been reviewed: From home Prior level of function:: Was independent Prior/Current Home Services: No current home services Social Drivers of Health Review: SDOH reviewed no interventions necessary Readmission risk has been reviewed: Yes Transition of care needs: no transition of care needs at this time

## 2024-03-26 NOTE — Progress Notes (Signed)
 Patient refused to take her Lovenox. NP Jesus made aware.

## 2024-03-26 NOTE — Plan of Care (Signed)
   Problem: Education: Goal: Knowledge of disease or condition will improve Outcome: Progressing   Problem: Ischemic Stroke/TIA Tissue Perfusion: Goal: Complications of ischemic stroke/TIA will be minimized Outcome: Progressing

## 2024-03-26 NOTE — Progress Notes (Signed)
 Patient passed swallow screen with no issues or complaints. MD Sreeram made aware. Diet order placed.

## 2024-03-27 ENCOUNTER — Inpatient Hospital Stay (HOSPITAL_COMMUNITY)
Admit: 2024-03-27 | Discharge: 2024-03-27 | Disposition: A | Discharge status: Home / Self Care | Attending: Internal Medicine | Admitting: Internal Medicine

## 2024-03-27 ENCOUNTER — Other Ambulatory Visit (HOSPITAL_COMMUNITY): Payer: Self-pay | Admitting: *Deleted

## 2024-03-27 ENCOUNTER — Inpatient Hospital Stay (HOSPITAL_COMMUNITY)

## 2024-03-27 DIAGNOSIS — R410 Disorientation, unspecified: Secondary | ICD-10-CM | POA: Diagnosis not present

## 2024-03-27 DIAGNOSIS — R4182 Altered mental status, unspecified: Secondary | ICD-10-CM

## 2024-03-27 DIAGNOSIS — I6389 Other cerebral infarction: Secondary | ICD-10-CM | POA: Diagnosis not present

## 2024-03-27 DIAGNOSIS — R569 Unspecified convulsions: Secondary | ICD-10-CM | POA: Diagnosis not present

## 2024-03-27 DIAGNOSIS — E119 Type 2 diabetes mellitus without complications: Secondary | ICD-10-CM | POA: Diagnosis not present

## 2024-03-27 DIAGNOSIS — N182 Chronic kidney disease, stage 2 (mild): Secondary | ICD-10-CM | POA: Diagnosis not present

## 2024-03-27 DIAGNOSIS — I639 Cerebral infarction, unspecified: Secondary | ICD-10-CM | POA: Diagnosis not present

## 2024-03-27 LAB — URINE CULTURE: Culture: NO GROWTH

## 2024-03-27 LAB — GLUCOSE, CAPILLARY
Glucose-Capillary: 152 mg/dL — ABNORMAL HIGH (ref 70–99)
Glucose-Capillary: 183 mg/dL — ABNORMAL HIGH (ref 70–99)
Glucose-Capillary: 147 mg/dL — ABNORMAL HIGH (ref 70–99)
Glucose-Capillary: 120 mg/dL — ABNORMAL HIGH (ref 70–99)

## 2024-03-27 LAB — CSF CELL COUNT WITH DIFFERENTIAL
RBC Count, CSF: 1975 /mm3 — ABNORMAL HIGH
Tube #: 2
WBC, CSF: 7 /mm3 — ABNORMAL HIGH (ref 0–5)

## 2024-03-27 LAB — AMMONIA: Ammonia: 18 umol/L (ref 9–35)

## 2024-03-27 LAB — HIV ANTIBODY (ROUTINE TESTING W REFLEX): HIV Screen 4th Generation wRfx: NONREACTIVE

## 2024-03-27 LAB — ECHOCARDIOGRAM COMPLETE
Area-P 1/2: 3.65 cm2
Height: 63 in
S' Lateral: 2.6 cm
Weight: 3072 [oz_av]

## 2024-03-27 LAB — BASIC METABOLIC PANEL WITH GFR
Anion gap: 13 (ref 5–15)
BUN: 22 mg/dL (ref 8–23)
CO2: 20 mmol/L — ABNORMAL LOW (ref 22–32)
Calcium: 9.1 mg/dL (ref 8.9–10.3)
Chloride: 106 mmol/L (ref 98–111)
Creatinine, Ser: 2.02 mg/dL — ABNORMAL HIGH (ref 0.44–1.00)
GFR, Estimated: 25 mL/min — ABNORMAL LOW (ref >60)
Glucose, Bld: 148 mg/dL — ABNORMAL HIGH (ref 70–99)
Potassium: 3.9 mmol/L (ref 3.5–5.1)
Sodium: 139 mmol/L (ref 135–145)

## 2024-03-27 MED ORDER — ASPIRIN 81 MG PO TBEC
81.0000 mg | DELAYED_RELEASE_TABLET | Freq: Every day | ORAL | Status: DC
  Administered 2024-03-28: 81 mg via ORAL
  Filled 2024-03-27: qty 1

## 2024-03-27 MED ORDER — LIDOCAINE 1 % OPTIME INJ - NO CHARGE
5.0000 mL | Freq: Once | INTRAMUSCULAR | Status: DC
  Administered 2024-03-27: 5 mL via SUBCUTANEOUS

## 2024-03-27 MED ORDER — LIDOCAINE HCL (PF) 1 % IJ SOLN
INTRAMUSCULAR | Status: AC
  Filled 2024-03-27: qty 5

## 2024-03-27 MED ORDER — LIDOCAINE HCL (PF) 1 % IJ SOLN
5.0000 mL | Freq: Once | INTRAMUSCULAR | Status: DC
Start: 2024-03-27 — End: 2024-03-28

## 2024-03-27 MED ORDER — ENOXAPARIN SODIUM 30 MG/0.3ML IJ SOSY
30.0000 mg | PREFILLED_SYRINGE | INTRAMUSCULAR | Status: DC
  Filled 2024-03-27: qty 0.3

## 2024-03-27 NOTE — Plan of Care (Signed)

## 2024-03-27 NOTE — Evaluation (Signed)
 Physical Therapy Evaluation Patient Details Name: Katherine Stokes MRN: [id number] DOB: December 29, 1950 Today's Date: 03/27/2024  History of Present Illness  Katherine Stokes is a 73 y.o. female with medical history significant of hypertension, diabetes type 2, history of stroke with residual right leg weakness, stage II chronic kidney disease.  Patient brought in by family due to confusion over the last 3 weeks.  She went to [facility] couple weeks ago for her confusion and was diagnosed with a UTI.  She was started on Keflex, but really has not improved.  Prior to this, the patient was fully independent and functioning well on her own.  Her memory was completely intact.  Now, she has difficulty remembering her daughter's name as well as the names of other people who she knew well prior to this.  In addition, she is unable to cook for herself, balancing her checkbook, feed herself, do her insulin , check her blood sugars - all of which she did prior to 2-3 weeks ago.   Clinical Impression  Patient functioning near baseline for functional mobility and gait demonstrating good return for ambulating in room, hallway without loss of balance. Plan:  Patient discharged from physical therapy to care of nursing for ambulation daily as tolerated for length of stay.          If plan is discharge home, recommend the following: Help with stairs or ramp for entrance   Can travel by private vehicle        Equipment Recommendations None recommended by PT  Recommendations for Other Services       Functional Status Assessment Patient has not had a recent decline in their functional status     Precautions / Restrictions Precautions Precautions: Fall Recall of Precautions/Restrictions: Impaired Restrictions Weight Bearing Restrictions Per Provider Order: No      Mobility  Bed Mobility Overal bed mobility: Independent                  Transfers Overall transfer level: Needs assistance    Transfers: Sit to/from Stand, Bed to chair/wheelchair/BSC Sit to Stand: Modified independent (Device/Increase time), Supervision           General transfer comment: slightly labored movement    Ambulation/Gait Ambulation/Gait assistance: Modified independent (Device/Increase time) Gait Distance (Feet): 100 Feet Assistive device: None Gait Pattern/deviations: WFL(Within Functional Limits) Gait velocity: decreased     General Gait Details: grossly WFL with good return for ambulating in room, hallway without loss of balance  Stairs            Wheelchair Mobility     Tilt Bed    Modified Rankin (Stroke Patients Only)       Balance Overall balance assessment: Mild deficits observed, not formally tested                                           Pertinent Vitals/Pain Pain Assessment Pain Assessment: No/denies pain    Home Living Family/patient expects to be discharged to:: Private residence Living Arrangements: Alone Available Help at Discharge: Family;Available PRN/intermittently Type of Home: House Home Access: Stairs to enter Entrance Stairs-Rails: None Entrance Stairs-Number of Steps: 2   Home Layout: One level Home Equipment: Cane - single point Additional Comments: Daughter reports that they plan to have the pt stay with them for a few days rather than be home alone.    Prior  Function Prior Level of Function : Independent/Modified Independent;Driving             Mobility Comments: Tourist information centre manager without AD ADLs Comments: Independent     Extremity/Trunk Assessment   Upper Extremity Assessment Upper Extremity Assessment: Defer to OT evaluation RUE Deficits / Details: 4-/5 R hand gross grasp, but this is the side of pt's previous stroke. Family reports pt has weakness on that side at baseline. Generally weak otherwise. RUE Sensation: WNL LUE Deficits / Details: WFL but generally weak. LUE Sensation: WNL LUE  Coordination: WNL    Lower Extremity Assessment Lower Extremity Assessment: Overall WFL for tasks assessed;RLE deficits/detail RLE Deficits / Details: grossly 4/5 RLE Sensation: WNL RLE Coordination: WNL    Cervical / Trunk Assessment Cervical / Trunk Assessment: Normal  Communication   Communication Communication: No apparent difficulties    Cognition Arousal: Alert Behavior During Therapy: WFL for tasks assessed/performed                             Following commands: Intact       Cueing Cueing Techniques: Verbal cues, Tactile cues, Visual cues     General Comments      Exercises     Assessment/Plan    PT Assessment Patient does not need any further PT services  PT Problem List         PT Treatment Interventions      PT Goals (Current goals can be found in the Care Plan section)  Acute Rehab PT Goals Patient Stated Goal: return home with family to assist PT Goal Formulation: With patient/family Time For Goal Achievement: 03/27/24 Potential to Achieve Goals: Good    Frequency       Co-evaluation PT/OT/SLP Co-Evaluation/Treatment: Yes Reason for Co-Treatment: To address functional/ADL transfers PT goals addressed during session: Mobility/safety with mobility OT goals addressed during session: ADL's and self-care       AM-PAC PT 6 Clicks Mobility  Outcome Measure Help needed turning from your back to your side while in a flat bed without using bedrails?: None Help needed moving from lying on your back to sitting on the side of a flat bed without using bedrails?: None Help needed moving to and from a bed to a chair (including a wheelchair)?: None Help needed standing up from a chair using your arms (e.g., wheelchair or bedside chair)?: None Help needed to walk in hospital room?: None Help needed climbing 3-5 steps with a railing? : A Little 6 Click Score: 23    End of Session   Activity Tolerance: Patient tolerated treatment  well Patient left: in bed;with family/visitor present Nurse Communication: Mobility status PT Visit Diagnosis: Unsteadiness on feet (R26.81);Other abnormalities of gait and mobility (R26.89);Muscle weakness (generalized) (M62.81)    Time: 9095-9080 PT Time Calculation (min) (ACUTE ONLY): 15 min   Charges:   PT Evaluation $PT Eval Low Complexity: 1 Low PT Treatments $Gait Training: 8-22 mins PT General Charges $$ ACUTE PT VISIT: 1 Visit         2:43 PM, 03/27/24 Lynwood Music, MPT Physical Therapist with [facility] [phone] office [phone] mobile phone

## 2024-03-27 NOTE — Progress Notes (Signed)
 E.E.G. in with patient at present time.           Aida Pizza, RCS

## 2024-03-27 NOTE — Plan of Care (Signed)
   Problem: Activity: Goal: Risk for activity intolerance will decrease Outcome: Progressing   Problem: Coping: Goal: Level of anxiety will decrease Outcome: Progressing

## 2024-03-27 NOTE — Progress Notes (Signed)
*  PRELIMINARY RESULTS* Echocardiogram 2D Echocardiogram has been performed with a saline bubble study.  Katherine Stokes 03/27/2024, 4:58 PM

## 2024-03-27 NOTE — Procedures (Signed)
 Patient Name: Katherine Stokes  MRN: [id number]  Epilepsy Attending: Arlin MALVA Krebs  Referring Physician/Provider: Krebs Arlin MALVA, MD Date: 03/27/2024 Duration: 23.21 mins  Patient history: 73yo F with ams. EEG to evaluate for seizure  Level of alertness: Awake  AEDs during EEG study: None  Technical aspects: This EEG study was done with scalp electrodes positioned according to the 10-20 International system of electrode placement. Electrical activity was reviewed with band pass filter of 1-70Hz , sensitivity of 7 uV/mm, display speed of 69mm/sec with a 60Hz  notched filter applied as appropriate. EEG data were recorded continuously and digitally stored.  Video monitoring was available and reviewed as appropriate.  Description: The posterior dominant rhythm consists of 9 Hz activity of moderate voltage (25-35 uV) seen predominantly in posterior head regions, symmetric and reactive to eye opening and eye closing. Hyperventilation and photic stimulation were not performed.     IMPRESSION: This study is within normal limits. No seizures or epileptiform discharges were seen throughout the recording.  A normal interictal EEG does not exclude the diagnosis of epilepsy.   Dailey Alberson O Yahayra Geis

## 2024-03-27 NOTE — TOC Progression Note (Signed)
 Transition of Care [facility]) - Progression Note    Patient Details  Name: KLEE KOLEK MRN: [id number] Date of Birth: Apr 25, 1951  Transition of [facility]) CM/SW Contact  Mcarthur Saddie Kim, [place] Phone Number: 03/27/2024, 12:40 PM  Clinical Narrative:  Pt's daughter requesting assistance at home per MD. LCSW spoke with pt's daughter. She is aware an aid to be with pt for several hours at home is private pay. Daughter asked about how to apply for Medicaid and LCSW encouraged her to call DSS to start process.         Barriers to Discharge: Continued Medical Work up               Expected Discharge Plan and Services                                               Social Drivers of Health (SDOH) Interventions SDOH Screenings   Food Insecurity: No Food Insecurity (03/25/2024)  Housing: Low Risk  (03/25/2024)  Transportation Needs: No Transportation Needs (03/25/2024)  Utilities: Not At Risk (03/25/2024)  Financial Resource Strain: Low Risk  (08/19/2023)   [facility] from [facility]  Physical Activity: Inactive (08/19/2023)   Received from [facility]  Social Connections: Moderately Integrated (03/25/2024)  Stress: Patient Declined (08/19/2023)   Received from [facility]  Tobacco Use: Low Risk  (03/25/2024)  Health Literacy: Low Risk  (08/19/2023)   Received from [facility]    Readmission Risk Interventions     No data to display

## 2024-03-27 NOTE — Progress Notes (Signed)
 Patient OTF for lumbar puncture per nurse.            Aida Pizza, RCS

## 2024-03-27 NOTE — Progress Notes (Signed)
 EEG complete - results pending

## 2024-03-27 NOTE — Consult Note (Signed)
 I connected with  Katherine Stokes on 03/27/24 by a video enabled telemedicine application and verified that I am speaking with the correct person using two identifiers.   I discussed the limitations of evaluation and management by telemedicine. The patient expressed understanding and agreed to proceed.  Location of patient: [facility] Location of physician: [facility]   Neurology Consultation Reason for Consult: stroke Referring Physician: Dr Darci Pore  CC: Memory loss  History is obtained from: patient, chart review  HPI: Katherine Stokes is a 73 y.o. female  with medical history of HTN, DM, HLD, CKD, Prior stroke with residual right leg weakness who was brought in by family due to confusion over the last 3 weeks.  Per daughter at bedside, patient is independent living alone at baseline, able to drive and manage all ADLS. However, since 3 weeks she has been confused, unable to remember things, and had slurred speech about a week ago which has since resolved. Similar presentation in past was due to hypoglycemia and viral illness. However, denies recent illness except possible UTI. Was given steroids/prednisone  about 3 weeks for back up which caused her blood sugar to go up which has since been discontinued.   ROS: All other systems reviewed and negative except as noted in the HPI.    Past Medical History:  Diagnosis Date   Asthma    Chronic kidney disease (CKD), stage II (mild)    Essential hypertension    GERD (gastroesophageal reflux disease)    History of stroke    Residual right leg weakness   Mixed hyperlipidemia    Stroke (HCC)    Type 2 diabetes mellitus (HCC)     Family History  Problem Relation Age of Onset   Dementia Mother    Colon cancer Father        age greater than 72    Social History:  reports that she has never smoked. She has never used smokeless tobacco. She reports that she does not drink alcohol and does not use drugs.  Medications Prior  to Admission  Medication Sig Dispense Refill Last Dose/Taking   albuterol  (PROVENTIL ) (2.5 MG/3ML) 0.083% nebulizer solution Take 2.5 mg by nebulization every 6 (six) hours as needed for wheezing or shortness of breath.   Unknown   albuterol  (VENTOLIN  HFA) 108 (90 Base) MCG/ACT inhaler Inhale 2 puffs into the lungs every 6 (six) hours as needed for wheezing or shortness of breath. 8 g 5 Unknown   allopurinol (ZYLOPRIM) 300 MG tablet Take 300 mg by mouth daily.   03/25/2024   aspirin  EC 81 MG tablet Take 162 mg by mouth daily. Swallow whole.   03/25/2024   carvedilol  (COREG ) 25 MG tablet Take 25 mg by mouth 2 (two) times daily with a meal.   03/25/2024   cholecalciferol (VITAMIN D) 25 MCG (1000 UNIT) tablet Take 1,000 Units by mouth daily.   03/25/2024   dapagliflozin propanediol (FARXIGA) 5 MG TABS tablet Take 5 mg by mouth daily.   03/25/2024   Fluticasone -Umeclidin-Vilant (TRELEGY ELLIPTA) 100-62.5-25 MCG/INH AEPB Inhale 1 puff into the lungs daily as needed (shortness of breath).   Unknown   insulin  aspart (NOVOLOG  FLEXPEN) 100 UNIT/ML FlexPen Inject 10 Units into the skin 3 (three) times daily with meals.   03/25/2024   insulin  glargine (LANTUS SOLOSTAR) 100 UNIT/ML Solostar Pen Inject 25 Units into the skin at bedtime.   03/24/2024   olmesartan (BENICAR) 40 MG tablet Take 40 mg by mouth daily.  03/25/2024   Omega-3 Fatty Acids (FISH OIL) 1000 MG CAPS Take 1,000 mg by mouth 3 (three) times daily.   03/25/2024   pantoprazole  (PROTONIX ) 40 MG tablet Take 1 tablet (40 mg total) by mouth 2 (two) times daily. 60 tablet 0 03/25/2024   vitamin B-12 (CYANOCOBALAMIN) 500 MCG tablet Take 500 mcg by mouth daily.   03/25/2024      Exam: Current vital signs: BP 112/61 (BP Location: Left Wrist)   Pulse 73   Temp 98.1 F (36.7 C)   Resp 16   Ht 5' 3 (1.6 m)   Wt 87.1 kg   SpO2 96%   BMI 34.01 kg/m  Vital signs in last 24 hours: Temp:  [97.7 F (36.5 C)-99 F (37.2 C)] 98.1 F (36.7 C) (10/06  0506) Pulse Rate:  [70-78] 73 (10/06 0506) Resp:  [16-18] 16 (10/06 0506) BP: (112-138)/(61-82) 112/61 (10/06 0506) SpO2:  [93 %-99 %] 96 % (10/06 0506)   Physical Exam  Constitutional: Appears well-developed and well-nourished.  Psych: Affect appropriate to situation Neuro: AOX3, no aphasia, can't do serial 7s, can't tell me days of week backwards, CN grossly intact, antigravity without drift in all extremities, sensory intact, FTN intact   I have reviewed labs in epic and the results pertinent to this consultation are: CBC:  Recent Labs  Lab 03/25/24 1328  WBC 9.8  NEUTROABS 4.9  HGB 12.1  HCT 37.5  MCV 94.5  PLT 308    Basic Metabolic Panel:  Lab Results  Component Value Date   NA 139 03/27/2024   K 3.9 03/27/2024   CO2 20 (L) 03/27/2024   GLUCOSE 148 (H) 03/27/2024   BUN 22 03/27/2024   CREATININE 2.02 (H) 03/27/2024   CALCIUM  9.1 03/27/2024   GFRNONAA 25 (L) 03/27/2024   GFRAA  02/27/2007    >60        The eGFR has been calculated using the MDRD equation. This calculation has not been validated in all clinical   Lipid Panel:  Lab Results  Component Value Date   LDLCALC 115 (H) 03/26/2024   HgbA1c:  Lab Results  Component Value Date   HGBA1C 7.6 (H) 03/26/2024   Urine Drug Screen: No results found for: LABOPIA, COCAINSCRNUR, LABBENZ, AMPHETMU, THCU, LABBARB  Alcohol Level No results found for: ETH   I have reviewed the images obtained:  CT Head without contrast 03/26/2024:  No acute intracranial abnormality. Stable right tentorial/cerebellopontine angle meningioma. Stable atrophy and chronic vessel ischemia.  MRI Brain wo contrast 03/26/2024:  8 mm acute infarct within the posterior limb of right internal capsule and right periatrial white matter. Background chronic small vessel ischemic disease and chronic infarcts, as described. Unchanged 15 mm right tentorial/cerebellopontine angle meningioma. As before, there is mild mass effect upon  the right aspect of the pons (without underlying parenchymal edema).  MRA head wo contrast 03/26/2024:  Right vertebral artery V4 segment occlusion with distal reconstitution via flow across the vertebral basilar confluence. Fetal origin of the right PCA.  ASSESSMENT/PLAN: 73yo F with new onset confusion for 3 weeks.  Acute ischemic stroke ( incidental) - Stroke is incidental and doesn't fully explain her ams for 3 weeks. Etiology of stroke likely small vessel disease - For stroke workup, recommend carotid US  and TTE: pending - For sec stroke prevention: ON ASA 81mg  daily at home. Recommend ASA 81 mg and plavix 75mg  daily for 3 weeks followed by plavix 75mg  daily - Continue Atorva 80mg  daily - Goal BP: normotension -  F/u with neuro in 4-6 weeks  Acute encephalopathy - ddx include UTI vs undiagnosed dementia in patient with poor neurocognitive reserve vs infection vs CJD ( less likely) - Please check ammonia, RPR - Routine eeg to look for epileptogenicity - Plase obtain fluoro guided LP to check cell count, protein, glucose, meningitis/encephalitis panel. Please send CSF encephalopathy panel to [facility]. If above negative, please add CJD panel and notify pathology - Delirium precautions   Thank you for allowing us  to participate in the care of this patient. If you have any further questions, please contact  me or neurohospitalist.   Arlin Krebs Epilepsy [facility] neurohospitalist

## 2024-03-27 NOTE — Progress Notes (Signed)
 SLP Cancellation Note  Patient Details Name: Katherine Stokes MRN: [id number] DOB: May 12, 1951   Cancelled treatment:       Reason Eval/Treat Not Completed: Other (comment) (Unable to complete SLE today due to scheduling conflicts. Pt was down for procedure. SLP will check back tomorrow.)  Thank you,  Lamar Candy, CCC-SLP [phone]  Jumaane Weatherford 03/27/2024, 4:14 PM

## 2024-03-27 NOTE — Progress Notes (Signed)
 Progress Note   Patient: Katherine Stokes FMW:[id number] DOB: 07-29-1950 DOA: 03/25/2024     2 DOS: the patient was seen and examined on 03/27/2024   Brief hospital course: Katherine Stokes is a 73 y.o. female with medical history significant of hypertension, diabetes type 2, history of stroke with residual right leg weakness, stage II chronic kidney disease brought in by family due to confusion over the last 3 weeks.  Patient had recent UTI and was treated with Keflex.  Initial CT head unremarkable  MRI of the brain showed 8 mm acute infarct within posterior limb of right internal capsule and right periatrial white matter.  Chronic small vessel ischemic disease, unchanged 15 mm right intrarenal cerebellopontine angle meningioma.  Discussed with neurology, advised further stroke workup.  Assessment and Plan: Acute ischemic stroke- She presented with altered mental status, confusion.  She does have prior history of strokes, takes aspirin  162 mg daily.  Not on statin. MRI of the brain showed 8 mm acute infarct within right internal capsule. Discussed with neurology Dr. Rhunette stroke workup, check A1c, lipid profile, MRA head  -Right vertebral artery V4 segment occlusion with distal reconstitution. Carotid ultrasound ,  Echocardiogram pending. A1C 7.6, LDL 115, TG 224. Neurology evaluation is appreciated, stroke is incidental and does not explain her confusion for the last 3 weeks.  Stroke likely due to small vessel disease.  Neurology advised fluoroscopic guided lumbar puncture, meningitis and encephalitis panel and if negative will need CJD panel. Continue aspirin  81mg , Plavix 75 mg daily for 3 weeks followed by Plavix 75 mg daily. Continue atorvastatin 80 mg daily.  Will resume Coreg , Avapro if BP higher side. PT/ OT evaluation suggested no follow up. TOC to talk to family as they requesting help at home as she lives alone.  Hypertension: Patient's blood pressure stable. Resume  beta-blocker, ARB if BP high side. IV hydralazine for blood pressure greater than 200s.  Type 2 diabetes mellitus: Hold Farxiga in the setting of UTI. A1c 7.6 Continue Accu-Cheks, sliding scale insulin .  Obesity class I with BMI 34.01- Diet, exercise and weight reduction advised.    Out of bed to chair. Incentive spirometry. Nursing supportive care. Fall, aspiration precautions. Diet:  Diet Orders (From admission, onward)     Start     Ordered   03/26/24 1138  Diet heart healthy/carb modified Room service appropriate? Yes; Fluid consistency: Thin  Diet effective now       Question Answer Comment  Diet-HS Snack? Nothing   Room service appropriate? Yes   Fluid consistency: Thin      03/26/24 1137           DVT prophylaxis: enoxaparin (LOVENOX) injection 30 mg Start: 03/27/24 2000  Level of care: Med-Surg   Code Status: Full Code  Subjective: Patient is seen and examined today morning.  She is getting out of bed.  Denies any headache, weakness.  Daughter at bedside updated regarding the current care plan.  Physical Exam: Vitals:   03/27/24 0408 03/27/24 0506 03/27/24 1250 03/27/24 1354  BP: 116/70 112/61 (!) 149/70 (!) 116/54  Pulse: 78 73 70 65  Resp: 18 16 15    Temp: 98 F (36.7 C) 98.1 F (36.7 C)  99.1 F (37.3 C)  TempSrc: Oral   Oral  SpO2: 97% 96% 100% 97%  Weight:      Height:        General - Elderly African-American obese female, no apparent distress HEENT - PERRLA, EOMI, atraumatic head, non  tender sinuses. Lung - Clear, basal rales, no rhonchi, wheezes. Heart - S1, S2 heard, no murmurs, rubs, trace pedal edema. Abdomen - Soft, non tender, obese, bowel sounds good Neuro - Alert, awake and oriented, non focal exam. Skin - Warm and dry.  Data Reviewed:      Latest Ref Rng & Units 03/25/2024    1:28 PM 05/20/2021    9:42 AM 04/05/2021    5:35 AM  CBC  WBC 4.0 - 10.5 K/uL 9.8  10.5  11.2   Hemoglobin 12.0 - 15.0 g/dL 87.8  89.0  8.6    Hematocrit 36.0 - 46.0 % 37.5  34.1  27.6   Platelets 150 - 400 K/uL 308  404  333       Latest Ref Rng & Units 03/27/2024    4:24 AM 03/25/2024    1:28 PM 04/05/2021    5:35 AM  BMP  Glucose 70 - 99 mg/dL 851  73  96   BUN 8 - 23 mg/dL 22  18  27    Creatinine 0.44 - 1.00 mg/dL 7.97  8.28  7.86   Sodium 135 - 145 mmol/L 139  142  139   Potassium 3.5 - 5.1 mmol/L 3.9  4.3  3.8   Chloride 98 - 111 mmol/L 106  107  111   CO2 22 - 32 mmol/L 20  22  22    Calcium  8.9 - 10.3 mg/dL 9.1  9.8  8.8    US  Carotid Bilateral Result Date: 03/27/2024 CLINICAL DATA:  Acute ischemic stroke. EXAM: BILATERAL CAROTID DUPLEX ULTRASOUND TECHNIQUE: Elnor scale imaging, color Doppler and duplex ultrasound were performed of bilateral carotid and vertebral arteries in the neck. COMPARISON:  Neck CTA 08/18/2023 FINDINGS: Criteria: Quantification of carotid stenosis is based on velocity parameters that correlate the residual internal carotid diameter with NASCET-based stenosis levels, using the diameter of the distal internal carotid lumen as the denominator for stenosis measurement. The following velocity measurements were obtained: RIGHT ICA: 105/32 cm/sec CCA: 97/24 cm/sec SYSTOLIC ICA/CCA RATIO:  1.1 ECA: 142 cm/sec LEFT ICA: 92/13 cm/sec CCA: 107/18 cm/sec SYSTOLIC ICA/CCA RATIO:  0.9 ECA: 100 cm/sec RIGHT CAROTID ARTERY: Intimal thickening in the right common carotid artery. Echogenic plaque at the right carotid bulb. External carotid artery is patent with normal waveform. Echogenic plaque in the internal carotid artery. Normal waveforms and velocities in the internal carotid artery. RIGHT VERTEBRAL ARTERY: Probable retrograde flow in the right vertebral artery. LEFT CAROTID ARTERY: Intimal thickening and heterogeneous plaque in the left common carotid artery. Echogenic plaque at the left carotid bulb. External carotid artery is patent with normal waveform. Echogenic plaque in the internal carotid artery. Normal  waveforms and velocities in the internal carotid artery. LEFT VERTEBRAL ARTERY: Antegrade flow and normal waveform in the left vertebral artery. IMPRESSION: 1. Atherosclerotic plaque involving bilateral carotid arteries. Estimated degree of stenosis in the internal carotid arteries is less than 50% bilaterally. 2. Concern for retrograde flow in the right vertebral artery. Consider further evaluation with a CTA. The right vertebral artery was patent on the CTA from 08/18/2023. Electronically Signed   By: Juliene Balder M.D.   On: 03/27/2024 13:38   DG FL GUIDED LUMBAR PUNCTURE Result Date: 03/27/2024 CLINICAL DATA:  Patient history of prior stroke, admitted for new confusion with unclear etiology x 3 weeks. Request for lumbar puncture for further evaluation. EXAM: LUMBAR PUNCTURE UNDER FLUOROSCOPY PROCEDURE: An appropriate skin entry site was determined fluoroscopically. Operator donned sterile gloves and mask. Skin  site was marked, then prepped with Betadine, draped in usual sterile fashion, and infiltrated locally with 1% lidocaine . A 22 gauge spinal needle advanced into the thecal sac at L2-L3 from a right interlaminar approach. Clear colorless CSF spontaneously returned, with opening pressure of 13 cm water. 7 ml CSF were collected and divided among 4 sterile vials for the requested laboratory studies. The needle was then removed. The patient tolerated the procedure well and there were no complications. FLUOROSCOPY: Radiation Exposure Index (as provided by the fluoroscopic device): 22.90 mGy Kerma IMPRESSION: Technically successful lumbar puncture under fluoroscopy. This exam was performed by Clotilda Hesselbach, PA-C, and was supervised and interpreted by Wilkie Lent, MD. Electronically Signed   By: Wilkie Lent M.D.   On: 03/27/2024 13:28   MR ANGIO HEAD WO CONTRAST Result Date: 03/26/2024 EXAM: MR Angiography Head without intravenous Contrast. 03/26/2024 01:11:47 PM TECHNIQUE: Magnetic resonance  angiography images of the head without intravenous contrast. Multiplanar 2D and 3D reformatted images are provided for review. COMPARISON: None provided. CLINICAL HISTORY: Neuro deficit, acute, stroke suspected. F/u stroke seen on MRI done earlier today. FINDINGS: ANTERIOR CIRCULATION: No significant stenosis of the internal carotid arteries. No significant stenosis of the anterior cerebral arteries. No significant stenosis of the middle cerebral arteries. No aneurysm. POSTERIOR CIRCULATION: The right vertebral artery V4 segment is occluded with distal reconstitution via flow across the vertebral basilar confluence. Fetal origin of the right PCA. No significant stenosis of the basilar artery. No aneurysm. IMPRESSION: 1. Right vertebral artery V4 segment occlusion with distal reconstitution via flow across the vertebral basilar confluence. 2. Fetal origin of the right PCA. Electronically signed by: Franky Stanford MD 03/26/2024 08:55 PM EDT RP Workstation: [id number]   MR BRAIN WO CONTRAST Result Date: 03/26/2024 : Provided history: Mental status change, unknown cause. EXAM: MRI HEAD WITHOUT CONTRAST TECHNIQUE: Multiplanar, multiecho pulse sequences of the brain and surrounding structures were obtained without intravenous contrast. COMPARISON:  Head CT 03/25/2024.  Brain MRI 08/19/2023. FINDINGS: Brain: No age-advanced or lobar predominant cerebral atrophy. 8 mm acute infarct within the posterior limb of right internal capsule and right periatrial white matter (series 5, image 28). Unchanged chronic lacunar infarcts within/about the bilateral deep gray nuclei. Background multifocal T2 FLAIR hyperintense signal abnormality within the cerebral white matter, nonspecific but compatible with moderate chronic small vessel ischemic disease. Unchanged chronic lacunar infarcts, and background chronic small vessel ischemic disease, within the pons. Small chronic infarct within the left cerebellar hemisphere, new from the prior  MRI of 08/19/2023 (series 10, image 6). 15 x 10 mm right tentorial/cerebellopontine angle mass consistent with a meningioma, unchanged from the prior MRI. As before, there is mild mass effect upon the right aspect of the pons. No underlying parenchymal edema. Several chronic microhemorrhages scattered within the supratentorial and infratentorial brain. No cortical encephalomalacia is identified. No extra-axial fluid collection. No midline shift. Vascular: Maintained flow voids within the proximal large arterial vessels. Skull and upper cervical spine: No focal worrisome marrow lesion. Sinuses/Orbits: No mass or acute finding within the imaged orbits. No significant paranasal sinus disease. Impression #1 will be called to the ordering clinician or representative by the Radiologist Assistant, and communication documented in the PACS or [employer]. IMPRESSION: 1. 8 mm acute infarct within the posterior limb of right internal capsule and right periatrial white matter. 2. Background chronic small vessel ischemic disease and chronic infarcts, as described. 3. Unchanged 15 mm right tentorial/cerebellopontine angle meningioma. As before, there is mild mass effect upon the right  aspect of the pons (without underlying parenchymal edema). Electronically Signed   By: Rockey Childs D.O.   On: 03/26/2024 10:28    Family Communication: Discussed with patient, daughter at bedside.  They understand and agree. All questions answered.  Disposition: Status is: Inpatient Remains inpatient appropriate because: Terminal status workup, lumbar puncture, neurology follow-up  Planned Discharge Destination: Home     Time spent: 48 minutes  Author: Concepcion Riser, MD 03/27/2024 4:27 PM Secure chat 7am to 7pm For on call review [email or web address].

## 2024-03-27 NOTE — Evaluation (Signed)
 Occupational Therapy Evaluation Patient Details Name: Katherine Stokes MRN: [id number] DOB: 29-Apr-1951 Today's Date: 03/27/2024   History of Present Illness   Katherine Stokes is a 73 y.o. female with medical history significant of hypertension, diabetes type 2, history of stroke with residual right leg weakness, stage II chronic kidney disease.  Patient brought in by family due to confusion over the last 3 weeks.  She went to [facility] couple weeks ago for her confusion and was diagnosed with a UTI.  She was started on Keflex, but really has not improved.  Prior to this, the patient was fully independent and functioning well on her own.  Her memory was completely intact.  Now, she has difficulty remembering her daughter's name as well as the names of other people who she knew well prior to this.  In addition, she is unable to cook for herself, balancing her checkbook, feed herself, do her insulin , check her blood sugars - all of which she did prior to 2-3 weeks ago. (per DO)     Clinical Impressions Pt agreeable to OT and PT co-evaluation. Pt presents with altered cognition from baseline according to pt's daughter. Pt was not fully oriented today but able to follow commands well. Mod I to supervision for mobility just due to cognitive changes. Able to complete ADL's without physical assist. Mild weakness in R hand but pt has history of stroke with R UE weakness. Pt left in the bed with call bell within reach and family present. Pt is not recommended for any further acute OT services and will be discharged to care of nursing staff for remaining length of stay.       If plan is discharge home, recommend the following:   A little help with walking and/or transfers;Direct supervision/assist for medications management;Assist for transportation     Functional Status Assessment   Patient has not had a recent decline in their functional status     Equipment Recommendations   None  recommended by OT     Recommendations for Other Services   Speech consult (For cognitive deficits.)     Precautions/Restrictions   Precautions Precautions: Fall Recall of Precautions/Restrictions: Impaired Restrictions Weight Bearing Restrictions Per Provider Order: No     Mobility Bed Mobility Overal bed mobility: Independent                  Transfers Overall transfer level: Needs assistance   Transfers: Sit to/from Stand, Bed to chair/wheelchair/BSC Sit to Stand: Modified independent (Device/Increase time), Supervision           General transfer comment: No physical assist. Supervision just for safety due to cognitive changes.      Balance Overall balance assessment: Mild deficits observed, not formally tested                                         ADL either performed or assessed with clinical judgement   ADL Overall ADL's : Needs assistance/impaired     Grooming: Modified independent;Supervision/safety;Standing       Lower Body Bathing: Modified independent;Supervison/ safety;Sitting/lateral leans       Lower Body Dressing: Modified independent;Supervision/safety;Sitting/lateral leans   Toilet Transfer: Modified Independent;Supervision/safety;Ambulation           Functional mobility during ADLs: Modified independent;Supervision/safety General ADL Comments: Pt able to transfer and ambulate without AD at level of mod I to supervision  mostly due to altered cognition. Supervision just for safety.     Vision Baseline Vision/History: 1 Wears glasses Ability to See in Adequate Light: 0 Adequate Patient Visual Report: No change from baseline Vision Assessment?: Wears glasses for reading;No apparent visual deficits     Perception Perception: Not tested       Praxis Praxis: Not tested       Pertinent Vitals/Pain Pain Assessment Pain Assessment: No/denies pain     Extremity/Trunk Assessment Upper Extremity  Assessment Upper Extremity Assessment: Generalized weakness;RUE deficits/detail;LUE deficits/detail RUE Deficits / Details: 4-/5 R hand gross grasp, but this is the side of pt's previous stroke. Family reports pt has weakness on that side at baseline. Generally weak otherwise. RUE Sensation: WNL LUE Deficits / Details: WFL but generally weak. LUE Sensation: WNL LUE Coordination: WNL   Lower Extremity Assessment Lower Extremity Assessment: Defer to PT evaluation   Cervical / Trunk Assessment Cervical / Trunk Assessment: Normal   Communication Communication Communication: No apparent difficulties   Cognition Arousal: Alert Behavior During Therapy: WFL for tasks assessed/performed Cognition: Cognition impaired   Orientation impairments: Situation, Time   Memory impairment (select all impairments): Declarative long-term memory     OT - Cognition Comments: Pt partially oriented but still confused compaired to baseline according to pt's daughter.                 Following commands: Intact       Cueing  General Comments   Cueing Techniques: Verbal cues;Tactile cues;Visual cues                 Home Living Family/patient expects to be discharged to:: Private residence Living Arrangements: Alone Available Help at Discharge: Family;Available PRN/intermittently Type of Home: House Home Access: Stairs to enter [employer] of Steps: 2 Entrance Stairs-Rails: None Home Layout: One level     Bathroom Shower/Tub: Chief Strategy Officer: Standard Bathroom Accessibility: Yes How Accessible: Accessible via wheelchair;Accessible via walker Home Equipment: Cane - single point   Additional Comments: Daughter reports that they plan to have the pt stay with them for a few days rather than be home alone.      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 ADLs Comments: Independent                            Co-evaluation PT/OT/SLP Co-Evaluation/Treatment: Yes Reason for Co-Treatment: To address functional/ADL transfers   OT goals addressed during session: ADL's and self-care                       End of Session    Activity Tolerance: Patient tolerated treatment well Patient left: in bed;with call bell/phone within reach;with family/visitor present  OT Visit Diagnosis: Other symptoms and signs involving cognitive function;Other symptoms and signs involving the nervous system (M70.101)                Time: 9094-9078 OT Time Calculation (min): 16 min Charges:  OT General Charges $OT Visit: 1 Visit OT Evaluation $OT Eval Low Complexity: 1 Low  Talon Regala OT, MOT  Jayson Person 03/27/2024, 1:57 PM

## 2024-03-27 NOTE — Procedures (Signed)
 Technically successful fluoro guided LP at L2-L3 level with opening pressure of 13 cm H2O.  7 cc of clear, colorless CSF sent to lab for analysis.  No immediate post procedural complication.  - Bedrest with bathroom privileges x 2 hours - Head of bed flat x 2 hours, may sit up x 10 minutes to eat/drink - Encourage PO fluids (with caffeine if patient can tolerate)  Please see imaging section of Epic for full dictation.  Clotilda Hesselbach, PA-C

## 2024-03-28 DIAGNOSIS — R410 Disorientation, unspecified: Secondary | ICD-10-CM | POA: Diagnosis not present

## 2024-03-28 DIAGNOSIS — E669 Obesity, unspecified: Secondary | ICD-10-CM

## 2024-03-28 DIAGNOSIS — E119 Type 2 diabetes mellitus without complications: Secondary | ICD-10-CM | POA: Diagnosis not present

## 2024-03-28 DIAGNOSIS — I639 Cerebral infarction, unspecified: Secondary | ICD-10-CM | POA: Diagnosis not present

## 2024-03-28 DIAGNOSIS — N182 Chronic kidney disease, stage 2 (mild): Secondary | ICD-10-CM | POA: Diagnosis not present

## 2024-03-28 LAB — BASIC METABOLIC PANEL WITH GFR
Anion gap: 12 (ref 5–15)
BUN: 19 mg/dL (ref 8–23)
CO2: 21 mmol/L — ABNORMAL LOW (ref 22–32)
Calcium: 9.3 mg/dL (ref 8.9–10.3)
Chloride: 106 mmol/L (ref 98–111)
Creatinine, Ser: 1.87 mg/dL — ABNORMAL HIGH (ref 0.44–1.00)
GFR, Estimated: 28 mL/min — ABNORMAL LOW (ref >60)
Glucose, Bld: 94 mg/dL (ref 70–99)
Potassium: 3.8 mmol/L (ref 3.5–5.1)
Sodium: 139 mmol/L (ref 135–145)

## 2024-03-28 LAB — RPR: RPR Ser Ql: NONREACTIVE

## 2024-03-28 LAB — GLUCOSE, CAPILLARY: Glucose-Capillary: 107 mg/dL — ABNORMAL HIGH (ref 70–99)

## 2024-03-28 LAB — VDRL, CSF: VDRL Quant, CSF: NONREACTIVE

## 2024-03-28 MED ORDER — PANTOPRAZOLE SODIUM 40 MG PO TBEC: 40.0000 mg | DELAYED_RELEASE_TABLET | Freq: Two times a day (BID) | ORAL | 1 refills | Status: AC

## 2024-03-28 MED ORDER — CLOPIDOGREL BISULFATE 75 MG PO TABS: 75.0000 mg | ORAL_TABLET | Freq: Every day | ORAL | 3 refills | Status: AC

## 2024-03-28 MED ORDER — ATORVASTATIN CALCIUM 80 MG PO TABS: 80.0000 mg | ORAL_TABLET | Freq: Every day | ORAL | 3 refills | Status: AC

## 2024-03-28 MED ORDER — ASPIRIN EC 81 MG PO TBEC: 81.0000 mg | DELAYED_RELEASE_TABLET | Freq: Every day | ORAL | 0 refills | Status: AC

## 2024-03-28 NOTE — Evaluation (Addendum)
 Speech Language Pathology Evaluation Patient Details Name: Katherine Stokes MRN: [id number] DOB: 29-Jan-1951 Today's Date: 03/28/2024 Time: 8984-8964 SLP Time Calculation (min) (ACUTE ONLY): 20 min  Problem List:  Patient Active Problem List   Diagnosis Date Noted   Acute ischemic stroke (HCC) 03/26/2024   Confusion 03/25/2024   Chronic kidney disease (CKD), stage II (mild)    Anemia associated with diabetes mellitus (HCC) 04/04/2021    Class: Chronic   Abdominal pain 04/04/2021    Class: Acute   DM II (diabetes mellitus, type II), controlled (HCC) 04/04/2021    Class: Chronic   Anemia    Esophageal dysphagia    AKI (acute kidney injury) 04/03/2021   MORBID OBESITY 12/12/2008   DIABETES MELLITUS, TYPE II, UNCONTROLLED, W/VASCULAR COMPS 09/12/2008   History of stroke 09/12/2008   SHOULDER PAIN, RIGHT 08/30/2008   NUMBNESS 06/05/2008   ASCUS PAP 08/01/2007   Essential hypertension 07/18/2007   Asthma 07/18/2007   Arthropathy 07/18/2007   KNEE PAIN, RIGHT, CHRONIC 07/18/2007   Past Medical History:  Past Medical History:  Diagnosis Date   Asthma    Chronic kidney disease (CKD), stage II (mild)    Essential hypertension    GERD (gastroesophageal reflux disease)    History of stroke    Residual right leg weakness   Mixed hyperlipidemia    Stroke (HCC)    Type 2 diabetes mellitus (HCC)    Past Surgical History:  Past Surgical History:  Procedure Laterality Date   ABDOMINAL HYSTERECTOMY     COLONOSCOPY WITH PROPOFOL  N/A 06/04/2021   Procedure: COLONOSCOPY WITH PROPOFOL ;  Surgeon: Katherine Angelia Sieving, MD;  Location: [facility];  Service: Gastroenterology;  Laterality: N/A;  12:15   ESOPHAGEAL DILATION N/A 04/04/2021   Procedure: ESOPHAGEAL DILATION;  Surgeon: Katherine Angelia Sieving, MD;  Location: [facility];  Service: Gastroenterology;  Laterality: N/A;   ESOPHAGOGASTRODUODENOSCOPY (EGD) WITH PROPOFOL  N/A 04/04/2021   Procedure: ESOPHAGOGASTRODUODENOSCOPY  (EGD) WITH PROPOFOL ;  Surgeon: Katherine Angelia Sieving, MD;  Location: [facility];  Service: Gastroenterology;  Laterality: N/A;   POLYPECTOMY  04/04/2021   Procedure: POLYPECTOMY;  Surgeon: Katherine Angelia Sieving, MD;  Location: [facility];  Service: Gastroenterology;;   POLYPECTOMY  06/04/2021   Procedure: POLYPECTOMY;  Surgeon: Katherine Angelia, Sieving, MD;  Location: [facility];  Service: Gastroenterology;;   TONSILLECTOMY     HPI:  Katherine Stokes is a 73 y.o. female with medical history significant of hypertension, diabetes type 2, history of stroke with residual right leg weakness, stage II chronic kidney disease.  Patient brought in by family due to confusion over the last 3 weeks.  She went to [facility] couple weeks ago for her confusion and was diagnosed with a UTI.  She was started on Keflex, but really has not improved.  Prior to this, the patient was fully independent and functioning well on her own.  Her memory was completely intact.  Now, she has difficulty remembering her daughter's name as well as the names of other people who she knew well prior to this.  In addition, she is unable to cook for herself, balancing her checkbook, feed herself, do her insulin , check her blood sugars - all of which she did prior to 2-3 weeks ago. ST consulted for speech/language cognitive assessment.   Assessment / Plan / Recommendation Clinical Impression  Pt assessed this date via [employer] Mental Status Examination (SLUMS) with a cumulative score of 18/30 (typical score is 27/30) with deficits noted  within memory retrieval and sustained attention with tasks such as object recall after a time delay, simple calculation, word fluency task and paragraph retention.  Pt did refuse certain tasks, but asked for repetition and/or clarification often.  Pt and daughter both noted changes in cognition since current CVA and were agreeable to [facility] SLP prn to provide cognitive/linguistic tx  to address above deficits.  Further cognitive assessment may be warranted if cognitive/linguistic skills continue to decline in a familiar setting.  ST will s/o in current acute setting, but recommend f/u at next venue of care.  Thank you for this consult.    SLP Assessment  SLP Recommendation/Assessment: All further Speech Language Pathology needs can be addressed in the next venue of care SLP Visit Diagnosis: Cognitive communication deficit (R41.841)     Assistance Recommended at Discharge  Intermittent Supervision/Assistance  Functional Status Assessment Patient has had a recent decline in their functional status and demonstrates the ability to make significant improvements in function in a reasonable and predictable amount of time.  Frequency and Duration Other (Comment) (evaluation only)         SLP Evaluation Cognition  Overall Cognitive Status: Impaired/Different from baseline Arousal/Alertness: Awake/alert Orientation Level: Oriented to person;Oriented to place;Oriented to situation;Disoriented to time Month: October Day of Week: Correct Attention: Sustained Sustained Attention: Impaired Sustained Attention Impairment: Verbal basic;Functional basic Memory: Impaired Memory Impairment: Retrieval deficit;Decreased recall of new information;Decreased short term memory Decreased Short Term Memory: Verbal basic;Functional basic Awareness: Appears intact Problem Solving: Appears intact Safety/Judgment: Other (comment) (DTA)       Comprehension  Auditory Comprehension Overall Auditory Comprehension: Appears within functional limits for tasks assessed Commands: Not tested Conversation: Simple Interfering Components: Working [employer] fast streamer;Attention EffectiveTechniques: Extra processing time;Repetition Visual Recognition/Discrimination Discrimination: Not tested Reading Comprehension Reading Status: Not tested    Expression Expression Primary Mode of Expression: Verbal Verbal  Expression Overall Verbal Expression: Appears within functional limits for tasks assessed Level of Generative/Spontaneous Verbalization: Conversation Naming: Impairment Responsive: 76-100% accurate Confrontation: Within functional limits Divergent: 25-49% accurate Pragmatics: No impairment Interfering Components: Attention Non-Verbal Means of Communication: Not applicable Written Expression Dominant Hand: Right Written Expression: Within Functional Limits   Oral / Motor  Oral Motor/Sensory Function Overall Oral Motor/Sensory Function: Within functional limits Motor Speech Overall Motor Speech: Appears within functional limits for tasks assessed Respiration: Within functional limits Phonation: Normal Resonance: Within functional limits Articulation: Within functional limitis Intelligibility: Intelligible Motor Planning: Within functional limits Motor Speech Errors: Not applicable            Bruna Clause, M.S., CCC-SLP 03/28/2024, 2:34 PM Late entry**

## 2024-03-28 NOTE — Discharge Summary (Signed)
 Physician Discharge Summary   Patient: Katherine Stokes MRN: [id number] DOB: 06-01-1951  Admit date:     03/25/2024  Discharge date: {dischdate:26783}  Discharge Physician: Concepcion Riser   PCP: Orpha Yancey LABOR, MD   Recommendations at discharge:  {Tip this will not be part of the note when signed- Example include specific recommendations for outpatient follow-up, pending tests to follow-up on. (Optional):26781}  ***  Discharge Diagnoses: Principal Problem:   Acute ischemic stroke [facility]) Active Problems:   MORBID OBESITY   Essential hypertension   History of stroke   DM II (diabetes mellitus, type II), controlled (HCC)   Confusion   Chronic kidney disease (CKD), stage II (mild)  Resolved Problems:   * No resolved hospital problems. [facility] Course: No notes on file  Assessment and Plan: No notes have been filed under this hospital service. Service: Hospitalist     {Tip this will not be part of the note when signed Body mass index is 34.01 kg/m. , ,  (Optional):26781}  {(NOTE) Pain control PDMP Statment (Optional):26782} Consultants: *** Procedures performed: ***  Disposition: {Plan; Disposition:26390} Diet recommendation:  Discharge Diet Orders (From admission, onward)     Start     Ordered   03/28/24 0000  Diet - low sodium heart healthy        03/28/24 1059   03/28/24 0000  Diet Carb Modified        03/28/24 1059           {Diet_Plan:26776} DISCHARGE MEDICATION: Allergies as of 03/28/2024       Reactions   Penicillins Swelling, Rash   Sulfa Antibiotics Rash        Medication List     TAKE these medications    albuterol  (2.5 MG/3ML) 0.083% nebulizer solution Commonly known as: PROVENTIL  Take 2.5 mg by nebulization every 6 (six) hours as needed for wheezing or shortness of breath.   albuterol  108 (90 Base) MCG/ACT inhaler Commonly known as: VENTOLIN  HFA Inhale 2 puffs into the lungs every 6 (six) hours as needed for wheezing or  shortness of breath.   allopurinol 300 MG tablet Commonly known as: ZYLOPRIM Take 300 mg by mouth daily.   aspirin  EC 81 MG tablet Take 1 tablet (81 mg total) by mouth daily. Swallow whole. What changed: how much to take   atorvastatin 80 MG tablet Commonly known as: LIPITOR Take 1 tablet (80 mg total) by mouth daily. Start taking on: March 29, 2024   carvedilol  25 MG tablet Commonly known as: COREG  Take 25 mg by mouth 2 (two) times daily with a meal.   cholecalciferol 25 MCG (1000 UNIT) tablet Commonly known as: VITAMIN D3 Take 1,000 Units by mouth daily.   clopidogrel 75 MG tablet Commonly known as: PLAVIX Take 1 tablet (75 mg total) by mouth daily. Start taking on: March 29, 2024   dapagliflozin propanediol 5 MG Tabs tablet Commonly known as: FARXIGA Take 5 mg by mouth daily.   Fish Oil 1000 MG Caps Take 1,000 mg by mouth 3 (three) times daily.   Lantus SoloStar 100 UNIT/ML Solostar Pen Generic drug: insulin  glargine Inject 25 Units into the skin at bedtime.   NovoLOG  FlexPen 100 UNIT/ML FlexPen Generic drug: insulin  aspart Inject 10 Units into the skin 3 (three) times daily with meals.   olmesartan 40 MG tablet Commonly known as: BENICAR Take 40 mg by mouth daily.   pantoprazole  40 MG tablet Commonly known as: PROTONIX  Take 1 tablet (40 mg total) by  mouth 2 (two) times daily.   Trelegy Ellipta 100-62.5-25 MCG/INH Aepb Generic drug: Fluticasone -Umeclidin-Vilant Inhale 1 puff into the lungs daily as needed (shortness of breath).   vitamin B-12 500 MCG tablet Commonly known as: CYANOCOBALAMIN Take 500 mcg by mouth daily.        Follow-up Information     Orpha Yancey LABOR, MD Follow up in 1 week(s).   Specialty: Internal Medicine Contact information: [address] [place] [address] [phone]         [facility] Follow up in 2 week(s).   Contact information: [address]  Jewell LABOR [facility]  [phone]                Discharge Exam: Filed Weights   03/25/24 1242  Weight: 87.1 kg   ***  Condition at discharge: {DC Condition:26389}  The results of significant diagnostics from this hospitalization (including imaging, microbiology, ancillary and laboratory) are listed below for reference.   Imaging Studies: ECHOCARDIOGRAM COMPLETE Result Date: 03/27/2024    ECHOCARDIOGRAM REPORT   Patient Name:   MAHITHA HICKLING Date of Exam: 03/27/2024 Medical Rec #:  [id number]        Height:       63.0 in Accession #:    [id number]       Weight:       192.0 lb Date of Birth:  1950-11-14        BSA:          1.900 m Patient Age:    73 years         BP: Patient Gender: F                HR:           69 bpm. Exam Location:  [facility] Procedure: 2D Echo (Both Spectral and Color Flow Doppler were utilized during            procedure).  Sonographer:    BW Referring Phys: [id number] CONCEPCION RISER IMPRESSIONS  1. Left ventricular ejection fraction, by estimation, is 65 to 70%. The left ventricle has normal function. The left ventricle has no regional wall motion abnormalities. There is moderate asymmetric left ventricular hypertrophy. Left ventricular diastolic parameters were normal.  2. Right ventricular systolic function is normal. The right ventricular size is normal.  3. The mitral valve is normal in structure. Trivial mitral valve regurgitation.  4. The aortic valve is tricuspid. Aortic valve regurgitation is not visualized.  5. The inferior vena cava is normal in size with greater than 50% respiratory variability, suggesting right atrial pressure of 3 mmHg. FINDINGS  Left Ventricle: Left ventricular ejection fraction, by estimation, is 65 to 70%. The left ventricle has normal function. The left ventricle has no regional wall motion abnormalities. The left ventricular internal cavity size was normal in size. There is  moderate asymmetric left ventricular hypertrophy. Left  ventricular diastolic parameters were normal. Right Ventricle: The right ventricular size is normal. Right vetricular wall thickness was not assessed. Right ventricular systolic function is normal. Left Atrium: Left atrial size was normal in size. Right Atrium: Right atrial size was normal in size. Pericardium: There is no evidence of pericardial effusion. Mitral Valve: The mitral valve is normal in structure. There is mild thickening of the mitral valve leaflet(s). Mild mitral annular calcification. Trivial mitral valve regurgitation. Tricuspid Valve: The tricuspid valve is normal in structure. Tricuspid valve regurgitation is trivial. Aortic Valve: The aortic valve is  tricuspid. Aortic valve regurgitation is not visualized. Pulmonic Valve: The pulmonic valve was normal in structure. Pulmonic valve regurgitation is mild. Aorta: The aortic root is normal in size and structure. Venous: The inferior vena cava is normal in size with greater than 50% respiratory variability, suggesting right atrial pressure of 3 mmHg. IAS/Shunts: No atrial level shunt detected by color flow Doppler.  LEFT VENTRICLE PLAX 2D LVIDd:         4.00 cm   Diastology LVIDs:         2.60 cm   LV e' medial:    6.09 cm/s LV PW:         1.10 cm   LV E/e' medial:  13.4 LV IVS:        1.50 cm   LV e' lateral:   8.21 cm/s LVOT diam:     1.70 cm   LV E/e' lateral: 10.0 LV SV:         46 LV SV Index:   24 LVOT Area:     2.27 cm  RIGHT VENTRICLE RV S prime:     14.00 cm/s TAPSE (M-mode): 2.5 cm LEFT ATRIUM             Index        RIGHT ATRIUM           Index LA diam:        3.50 cm 1.84 cm/m   RA Area:     13.00 cm LA Vol (A2C):   32.0 ml 16.84 ml/m  RA Volume:   29.60 ml  15.58 ml/m LA Vol (A4C):   55.9 ml 29.42 ml/m LA Biplane Vol: 44.5 ml 23.42 ml/m  AORTIC VALVE LVOT Vmax:   90.60 cm/s LVOT Vmean:  62.000 cm/s LVOT VTI:    0.204 m  AORTA Ao Root diam: 3.30 cm MITRAL VALVE MV Area (PHT): 3.65 cm    SHUNTS MV Decel Time: 208 msec    Systemic  VTI:  0.20 m MV E velocity: 81.80 cm/s  Systemic Diam: 1.70 cm MV A velocity: 90.60 cm/s MV E/A ratio:  0.90 Vina Gull MD Electronically signed by Vina Gull MD Signature Date/Time: 03/27/2024/4:48:31 PM    Final    EEG adult Result Date: 03/27/2024 Shelton Arlin KIDD, MD     03/27/2024  4:48 PM Patient Name: VERDELL KINCANNON MRN: [id number] Epilepsy Attending: Arlin KIDD Shelton Referring Physician/Provider: Shelton Arlin KIDD, MD Date: 03/27/2024 Duration: 23.21 mins Patient history: 73yo F with ams. EEG to evaluate for seizure Level of alertness: Awake AEDs during EEG study: None Technical aspects: This EEG study was done with scalp electrodes positioned according to the 10-20 International system of electrode placement. Electrical activity was reviewed with band pass filter of 1-70Hz , sensitivity of 7 uV/mm, display speed of 57mm/sec with a 60Hz  notched filter applied as appropriate. EEG data were recorded continuously and digitally stored.  Video monitoring was available and reviewed as appropriate. Description: The posterior dominant rhythm consists of 9 Hz activity of moderate voltage (25-35 uV) seen predominantly in posterior head regions, symmetric and reactive to eye opening and eye closing. Hyperventilation and photic stimulation were not performed.   IMPRESSION: This study is within normal limits. No seizures or epileptiform discharges were seen throughout the recording. A normal interictal EEG does not exclude the diagnosis of epilepsy. Priyanka KIDD Shelton   US  Carotid Bilateral Result Date: 03/27/2024 CLINICAL DATA:  Acute ischemic stroke. EXAM: BILATERAL CAROTID DUPLEX ULTRASOUND TECHNIQUE: Elnor scale imaging, color  Doppler and duplex ultrasound were performed of bilateral carotid and vertebral arteries in the neck. COMPARISON:  Neck CTA 08/18/2023 FINDINGS: Criteria: Quantification of carotid stenosis is based on velocity parameters that correlate the residual internal carotid diameter with NASCET-based  stenosis levels, using the diameter of the distal internal carotid lumen as the denominator for stenosis measurement. The following velocity measurements were obtained: RIGHT ICA: 105/32 cm/sec CCA: 97/24 cm/sec SYSTOLIC ICA/CCA RATIO:  1.1 ECA: 142 cm/sec LEFT ICA: 92/13 cm/sec CCA: 107/18 cm/sec SYSTOLIC ICA/CCA RATIO:  0.9 ECA: 100 cm/sec RIGHT CAROTID ARTERY: Intimal thickening in the right common carotid artery. Echogenic plaque at the right carotid bulb. External carotid artery is patent with normal waveform. Echogenic plaque in the internal carotid artery. Normal waveforms and velocities in the internal carotid artery. RIGHT VERTEBRAL ARTERY: Probable retrograde flow in the right vertebral artery. LEFT CAROTID ARTERY: Intimal thickening and heterogeneous plaque in the left common carotid artery. Echogenic plaque at the left carotid bulb. External carotid artery is patent with normal waveform. Echogenic plaque in the internal carotid artery. Normal waveforms and velocities in the internal carotid artery. LEFT VERTEBRAL ARTERY: Antegrade flow and normal waveform in the left vertebral artery. IMPRESSION: 1. Atherosclerotic plaque involving bilateral carotid arteries. Estimated degree of stenosis in the internal carotid arteries is less than 50% bilaterally. 2. Concern for retrograde flow in the right vertebral artery. Consider further evaluation with a CTA. The right vertebral artery was patent on the CTA from 08/18/2023. Electronically Signed   By: Juliene Balder M.D.   On: 03/27/2024 13:38   DG FL GUIDED LUMBAR PUNCTURE Result Date: 03/27/2024 CLINICAL DATA:  Patient history of prior stroke, admitted for new confusion with unclear etiology x 3 weeks. Request for lumbar puncture for further evaluation. EXAM: LUMBAR PUNCTURE UNDER FLUOROSCOPY PROCEDURE: An appropriate skin entry site was determined fluoroscopically. Operator donned sterile gloves and mask. Skin site was marked, then prepped with Betadine, draped  in usual sterile fashion, and infiltrated locally with 1% lidocaine . A 22 gauge spinal needle advanced into the thecal sac at L2-L3 from a right interlaminar approach. Clear colorless CSF spontaneously returned, with opening pressure of 13 cm water. 7 ml CSF were collected and divided among 4 sterile vials for the requested laboratory studies. The needle was then removed. The patient tolerated the procedure well and there were no complications. FLUOROSCOPY: Radiation Exposure Index (as provided by the fluoroscopic device): 22.90 mGy Kerma IMPRESSION: Technically successful lumbar puncture under fluoroscopy. This exam was performed by Clotilda Hesselbach, PA-C, and was supervised and interpreted by Wilkie Lent, MD. Electronically Signed   By: Wilkie Lent M.D.   On: 03/27/2024 13:28   MR ANGIO HEAD WO CONTRAST Result Date: 03/26/2024 EXAM: MR Angiography Head without intravenous Contrast. 03/26/2024 01:11:47 PM TECHNIQUE: Magnetic resonance angiography images of the head without intravenous contrast. Multiplanar 2D and 3D reformatted images are provided for review. COMPARISON: None provided. CLINICAL HISTORY: Neuro deficit, acute, stroke suspected. F/u stroke seen on MRI done earlier today. FINDINGS: ANTERIOR CIRCULATION: No significant stenosis of the internal carotid arteries. No significant stenosis of the anterior cerebral arteries. No significant stenosis of the middle cerebral arteries. No aneurysm. POSTERIOR CIRCULATION: The right vertebral artery V4 segment is occluded with distal reconstitution via flow across the vertebral basilar confluence. Fetal origin of the right PCA. No significant stenosis of the basilar artery. No aneurysm. IMPRESSION: 1. Right vertebral artery V4 segment occlusion with distal reconstitution via flow across the vertebral basilar confluence. 2. Fetal origin of  the right PCA. Electronically signed by: Franky Stanford MD 03/26/2024 08:55 PM EDT RP Workstation: [id number]   MR  BRAIN WO CONTRAST Result Date: 03/26/2024 : Provided history: Mental status change, unknown cause. EXAM: MRI HEAD WITHOUT CONTRAST TECHNIQUE: Multiplanar, multiecho pulse sequences of the brain and surrounding structures were obtained without intravenous contrast. COMPARISON:  Head CT 03/25/2024.  Brain MRI 08/19/2023. FINDINGS: Brain: No age-advanced or lobar predominant cerebral atrophy. 8 mm acute infarct within the posterior limb of right internal capsule and right periatrial white matter (series 5, image 28). Unchanged chronic lacunar infarcts within/about the bilateral deep gray nuclei. Background multifocal T2 FLAIR hyperintense signal abnormality within the cerebral white matter, nonspecific but compatible with moderate chronic small vessel ischemic disease. Unchanged chronic lacunar infarcts, and background chronic small vessel ischemic disease, within the pons. Small chronic infarct within the left cerebellar hemisphere, new from the prior MRI of 08/19/2023 (series 10, image 6). 15 x 10 mm right tentorial/cerebellopontine angle mass consistent with a meningioma, unchanged from the prior MRI. As before, there is mild mass effect upon the right aspect of the pons. No underlying parenchymal edema. Several chronic microhemorrhages scattered within the supratentorial and infratentorial brain. No cortical encephalomalacia is identified. No extra-axial fluid collection. No midline shift. Vascular: Maintained flow voids within the proximal large arterial vessels. Skull and upper cervical spine: No focal worrisome marrow lesion. Sinuses/Orbits: No mass or acute finding within the imaged orbits. No significant paranasal sinus disease. Impression #1 will be called to the ordering clinician or representative by the Radiologist Assistant, and communication documented in the PACS or [employer]. IMPRESSION: 1. 8 mm acute infarct within the posterior limb of right internal capsule and right periatrial white matter.  2. Background chronic small vessel ischemic disease and chronic infarcts, as described. 3. Unchanged 15 mm right tentorial/cerebellopontine angle meningioma. As before, there is mild mass effect upon the right aspect of the pons (without underlying parenchymal edema). Electronically Signed   By: Rockey Childs D.O.   On: 03/26/2024 10:28   CT Head Wo Contrast Result Date: 03/25/2024 CLINICAL DATA:  Mental status change, persistent or worsening EXAM: CT HEAD WITHOUT CONTRAST TECHNIQUE: Contiguous axial images were obtained from the base of the skull through the vertex without intravenous contrast. RADIATION DOSE REDUCTION: This exam was performed according to the departmental dose-optimization program which includes automated exposure control, adjustment of the mA and/or kV according to patient size and/or use of iterative reconstruction technique. COMPARISON:  Head CT 08/18/2023 FINDINGS: Brain: No acute intracranial hemorrhage. Stable size and appearance of calcified mass along the medial aspect of the right tentorium measuring 1.4 x 0.8 x 1.3 cm, series 2, image 9. This again abuts the pons without definite associated edema on the current exam. 5 mm dural calcification versus small calcified meningioma in the left paraclinoid region, series 2, image 7, unchanged. No associated mass effect. No evidence of acute ischemia, subdural or extra-axial collection. Stable degree of atrophy and chronic small vessel ischemia. Grossly stable small lacunar infarcts in the bilateral basal ganglia. Vascular: Atherosclerosis of skullbase vasculature without hyperdense vessel or abnormal calcification. Skull: No fracture or focal lesion. Sinuses/Orbits: No acute finding. Other: None. IMPRESSION: 1. No acute intracranial abnormality. 2. Stable right tentorial/cerebellopontine angle meningioma. 3. Stable atrophy and chronic vessel ischemia. Electronically Signed   By: Andrea Gasman M.D.   On: 03/25/2024 15:30     Microbiology: Results for orders placed or performed during the hospital encounter of 03/25/24  Urine Culture (for pregnant,  neutropenic or urologic patients or patients with an indwelling urinary catheter)     Status: None   Collection Time: 03/25/24 10:29 PM   Specimen: Urine, Clean Catch  Result Value Ref Range Status   Specimen Description   Final    URINE, CLEAN CATCH Performed at [facility], [address] [place], [place] [address]    Special Requests   Final    NONE Performed at [facility], [address] [place], [place] [address]    Culture   Final    NO GROWTH Performed at [facility], [address], [place], [place] [address]    Report Status 03/27/2024 FINAL  Final  CSF culture w Gram Stain     Status: None (Preliminary result)   Collection Time: 03/27/24 12:18 PM   Specimen: CSF  Result Value Ref Range Status   Specimen Description CSF  Final   Special Requests NONE  Final   Gram Stain   Final    NO WBC SEEN NO ORGANISMS SEEN Performed at [facility], [address], [place], [place] [address]    Culture PENDING  Incomplete   Report Status PENDING  Incomplete    Labs: CBC: Recent Labs  Lab 03/25/24 1328  WBC 9.8  NEUTROABS 4.9  HGB 12.1  HCT 37.5  MCV 94.5  PLT 308   Basic Metabolic Panel: Recent Labs  Lab 03/25/24 1328 03/27/24 0424 03/28/24 0441  NA 142 139 139  K 4.3 3.9 3.8  CL 107 106 106  CO2 22 20* 21*  GLUCOSE 73 148* 94  BUN 18 22 19   CREATININE 1.71* 2.02* 1.87*  CALCIUM  9.8 9.1 9.3   Liver Function Tests: Recent Labs  Lab 03/25/24 1328  AST 16  ALT 12  ALKPHOS 63  BILITOT 0.3  PROT 7.5  ALBUMIN  4.0   CBG: Recent Labs  Lab 03/27/24 0724 03/27/24 1127 03/27/24 1618 03/27/24 1942 03/28/24 0737  GLUCAP 152* 183* 147* 120* 107*    Discharge time spent: {LESS THAN/GREATER UYJW:73611} 30 minutes.  Signed: Concepcion Riser, MD [facility] 03/28/2024

## 2024-03-29 ENCOUNTER — Telehealth: Payer: Self-pay

## 2024-03-29 DIAGNOSIS — E669 Obesity, unspecified: Secondary | ICD-10-CM | POA: Insufficient documentation

## 2024-03-29 LAB — MISC LABCORP TEST (SEND OUT): Labcorp test code: 183410

## 2024-03-29 NOTE — Transitions of Care (Post Inpatient/ED Visit) (Signed)
   03/29/2024  Name: Katherine Stokes MRN: [id number] DOB: November 26, 1950  Today's TOC FU Call Status: Today's TOC FU Call Status:: Unsuccessful Call (1st Attempt) Unsuccessful Call (1st Attempt) Date: 03/29/24  Attempted to reach the patient regarding the most recent Inpatient/ED visit.  Follow Up Plan: Additional outreach attempts will be made to reach the patient to complete the Transitions of Care (Post Inpatient/ED visit) call.   Alexie Lanni J. Kewanna Kasprzak RN, MSN [facility], [facility] RN Care Manager Direct Dial: [phone]  Fax: [phone] Website: [email or web address]

## 2024-03-30 ENCOUNTER — Telehealth: Payer: Self-pay

## 2024-03-30 NOTE — Transitions of Care (Post Inpatient/ED Visit) (Signed)
   03/30/2024  Name: Katherine Stokes MRN: [id number] DOB: 1950-09-12  Today's TOC FU Call Status: Today's TOC FU Call Status:: Unsuccessful Call (2nd Attempt) Unsuccessful Call (2nd Attempt) Date: 03/30/24  Attempted to reach the patient regarding the most recent Inpatient/ED visit.  Follow Up Plan: Additional outreach attempts will be made to reach the patient to complete the Transitions of Care (Post Inpatient/ED visit) call.   Deveron Shamoon J. Ilsa Bonello RN, MSN [facility], [facility] RN Care Manager Direct Dial: [phone]  Fax: [phone] Website: [email or web address]

## 2024-03-31 ENCOUNTER — Telehealth: Payer: Self-pay

## 2024-03-31 DIAGNOSIS — Z09 Encounter for follow-up examination after completed treatment for conditions other than malignant neoplasm: Secondary | ICD-10-CM | POA: Diagnosis not present

## 2024-03-31 DIAGNOSIS — G3184 Mild cognitive impairment, so stated: Secondary | ICD-10-CM | POA: Diagnosis not present

## 2024-03-31 DIAGNOSIS — Z8673 Personal history of transient ischemic attack (TIA), and cerebral infarction without residual deficits: Secondary | ICD-10-CM | POA: Diagnosis not present

## 2024-03-31 LAB — CSF CULTURE W GRAM STAIN
Culture: NO GROWTH
Gram Stain: NONE SEEN

## 2024-03-31 LAB — OLIGOCLONAL BANDS, CSF + SERM

## 2024-03-31 NOTE — Transitions of Care (Post Inpatient/ED Visit) (Signed)
   03/31/2024  Name: TIHANNA GOODSON MRN: [id number] DOB: October 15, 1950  Today's TOC FU Call Status: Today's TOC FU Call Status:: Unsuccessful Call (3rd Attempt) Unsuccessful Call (3rd Attempt) Date: 03/31/24  Attempted to reach the patient regarding the most recent Inpatient/ED visit.  Follow Up Plan: No further outreach attempts will be made at this time. We have been unable to contact the patient.  Evamaria Detore J. Trapper Meech RN, MSN [facility], [facility] RN Care Manager Direct Dial: [phone]  Fax: [phone] Website: [email or web address]

## 2024-04-03 DIAGNOSIS — H401121 Primary open-angle glaucoma, left eye, mild stage: Secondary | ICD-10-CM | POA: Diagnosis not present

## 2024-04-03 LAB — CREUTZFELDT JAKOB PROTEIN
PHOPSPHO-TAU (181P): 17.3 pg/mL
Total Tau: 477 pg/mL — ABNORMAL HIGH (ref <=393)
t-Tau/p-Tau: 28 ratio — ABNORMAL HIGH (ref <=18)

## 2024-04-17 DIAGNOSIS — N1832 Chronic kidney disease, stage 3b: Secondary | ICD-10-CM | POA: Diagnosis not present

## 2024-04-17 DIAGNOSIS — E1122 Type 2 diabetes mellitus with diabetic chronic kidney disease: Secondary | ICD-10-CM | POA: Diagnosis not present

## 2024-04-25 DIAGNOSIS — F03B0 Unspecified dementia, moderate, without behavioral disturbance, psychotic disturbance, mood disturbance, and anxiety: Secondary | ICD-10-CM | POA: Diagnosis not present

## 2024-04-25 DIAGNOSIS — Z6833 Body mass index (BMI) 33.0-33.9, adult: Secondary | ICD-10-CM | POA: Diagnosis not present

## 2024-04-25 DIAGNOSIS — F332 Major depressive disorder, recurrent severe without psychotic features: Secondary | ICD-10-CM | POA: Diagnosis not present

## 2024-04-26 ENCOUNTER — Encounter: Payer: Self-pay | Admitting: Physician Assistant

## 2024-05-15 DIAGNOSIS — E1122 Type 2 diabetes mellitus with diabetic chronic kidney disease: Secondary | ICD-10-CM | POA: Diagnosis not present

## 2024-05-15 DIAGNOSIS — N1832 Chronic kidney disease, stage 3b: Secondary | ICD-10-CM | POA: Diagnosis not present

## 2024-06-01 DIAGNOSIS — Z6832 Body mass index (BMI) 32.0-32.9, adult: Secondary | ICD-10-CM | POA: Diagnosis not present

## 2024-06-01 DIAGNOSIS — N1832 Chronic kidney disease, stage 3b: Secondary | ICD-10-CM | POA: Diagnosis not present

## 2024-06-01 DIAGNOSIS — F03B0 Unspecified dementia, moderate, without behavioral disturbance, psychotic disturbance, mood disturbance, and anxiety: Secondary | ICD-10-CM | POA: Diagnosis not present

## 2024-06-01 DIAGNOSIS — F332 Major depressive disorder, recurrent severe without psychotic features: Secondary | ICD-10-CM | POA: Diagnosis not present

## 2024-06-01 DIAGNOSIS — Z8673 Personal history of transient ischemic attack (TIA), and cerebral infarction without residual deficits: Secondary | ICD-10-CM | POA: Diagnosis not present

## 2024-06-01 DIAGNOSIS — K219 Gastro-esophageal reflux disease without esophagitis: Secondary | ICD-10-CM | POA: Diagnosis not present

## 2024-06-01 DIAGNOSIS — E7849 Other hyperlipidemia: Secondary | ICD-10-CM | POA: Diagnosis not present

## 2024-06-01 DIAGNOSIS — E1122 Type 2 diabetes mellitus with diabetic chronic kidney disease: Secondary | ICD-10-CM | POA: Diagnosis not present

## 2024-06-01 DIAGNOSIS — I10 Essential (primary) hypertension: Secondary | ICD-10-CM | POA: Diagnosis not present

## 2024-07-06 NOTE — Progress Notes (Incomplete)
 "      Memory Impairment, concern for Alzheimer's disease  Katherine Stokes is a very pleasant 74 y.o. year old RH female with a history of hypertension, hyperlipidemia, DM2, mild CKD, gout, recent right internal capsule ischemic stroke October 2025 with mild residual right-sided weakness, seen today for evaluation of memory loss. MoCA today is ***. Etiology is ***. Patient is able to participate on ADLs and to to drive without difficulties. Mood is *** . Patient is accompanied by daughter*** who supplement the history.  Follow up in *** months  *** pending on the above results  MRI brain to further evaluate for structural abnormalities and vascular load  Neuropsychological evaluation for further investigate other causes of memory loss including sleep, attention, anxiety, depression among others Monitor driving  Ischemic stroke with residual right-sided weakness Continue Plavix  75 mg daily atorvastatin  80 mg daily Secondary stroke prevention, with goal BP less than 140/90, LDL 70 and A1c less than 6  Discussed the use of AI scribe software for clinical note transcription with the patient, who gave verbal consent to proceed.  History of Present Illness      How long did patient have memory difficulties?  For about.  After her UTI in October 2025 with increased confusion, care cognitive status seem to have worsened.  Patient reports some difficulty remembering new information, recent conversations, names. repeats oneself?  Endorsed often repeating questions. Disoriented when walking into a room? Denies ***  Leaving objects in unusual places?  Denies.   Wandering behavior? Denies.   Any personality changes, or depression, anxiety? Denies *** Hallucinations or paranoia? Denies.   Seizures? Denies.    Any sleep changes?  Sleeps well *** Does not sleep well. **  frequent nightmares or dream reenactment, other REM behavior or sleepwalking   Sleep apnea? Denies.   Any hygiene concerns?  Denies.    Independent of bathing and dressing? Endorsed  Does the patient need help with medications?  Daughter is in charge *** Who is in charge of the finances?  Daughter is in charge   *** Any changes in appetite?   Denies. ***   Patient have trouble swallowing?  Denies.   Does the patient cook? No*** yes, denies forgetting common recipes or kitchen accidents *** Any headaches?  Denies.   Chronic pain? Denies.   Ambulates with difficulty? Denies. ***  Needs a cane*** Needs a walker *** to ambulate for stability.   Recent falls or head injuries? Denies.     Vision changes?  Denies any new issues.  Has a history of*** Any strokelike symptoms? Denies.   Any tremors? Denies. *** Any anosmia? Denies.   Any incontinence of urine?  Recent UTI Any bowel dysfunction? Denies.      Patient lives with ***  History of heavy alcohol intake? Denies.   History of heavy tobacco use? Denies.   Family history of dementia?   *** with dementia  Does patient drive yes, short distances and familiar places.  Does not drive frequently Pertinent labs B12 974, TSH 1.06 TG 224, HDL 22, VLDL 45, LDL 115   LP studies  T tau 477,  T tau/ptau ration 28 pTau 181 was normal 17.3. pTau 217 .   MRI of the brain 03/26/2024, personally reviewed 1. 8 mm acute infarct within the posterior limb of right internal capsule and right periatrial white matter, chronic small vessel ischemic disease and chronic infarcts and unchanged 15 mm right tentorial/cerebellopontine angle meningioma. As before, there is mild  mass effect upon the right aspect of the pons (without underlying parenchymal edema).  MRA of the head remarkable for right vertebral artery V4 segment occlusion with distal reconstitution   Allergies[1]  Current Outpatient Medications  Medication Instructions   albuterol  (PROVENTIL ) 2.5 mg, Nebulization, Every 6 hours PRN   albuterol  (VENTOLIN  HFA) 108 (90 Base) MCG/ACT inhaler 2 puffs, Inhalation, Every 6 hours PRN    allopurinol (ZYLOPRIM) 300 mg, Oral, Daily   aspirin  EC 81 mg, Oral, Daily, Swallow whole.   atorvastatin  (LIPITOR) 80 mg, Oral, Daily   carvedilol  (COREG ) 25 mg, Oral, 2 times daily with meals   cholecalciferol (VITAMIN D3) 1,000 Units, Oral, Daily   clopidogrel  (PLAVIX ) 75 mg, Oral, Daily   dapagliflozin propanediol (FARXIGA) 5 mg, Oral, Daily   Fish Oil 1,000 mg, Oral, 3 times daily   Fluticasone -Umeclidin-Vilant (TRELEGY ELLIPTA) 100-62.5-25 MCG/INH AEPB 1 puff, Inhalation, Daily PRN   Lantus  SoloStar 25 Units, Subcutaneous, Daily at bedtime   NovoLOG  FlexPen 10 Units, Subcutaneous, 3 times daily with meals   olmesartan (BENICAR) 40 mg, Oral, Daily   pantoprazole  (PROTONIX ) 40 mg, Oral, 2 times daily   vitamin B-12 (CYANOCOBALAMIN ) 500 mcg, Oral, Daily    VITALS:  There were no vitals filed for this visit.   Neurological Exam      No data to display              No data to display            Orientation:  Alert and oriented to person, place and not to time***. No aphasia or dysarthria. Fund of knowledge is appropriate. Recent and remote memory impaired.  Attention and concentration are reduced***.  Able to name objects and repeat phrases. *** Delayed recall  /5 .*** Cranial nerves: There is good facial symmetry. Extraocular muscles are intact and visual fields are full to confrontational testing. Speech is fluent and clear. No tongue deviation. Hearing is intact to conversational tone.*** Tone: Tone is good throughout. Sensation: Sensation is intact to light touch.  Vibration is intact at the bilateral big toe.  Coordination: The patient has no difficulty with RAM's or FNF bilaterally. Normal finger to nose  Motor: Strength is 5/5 on the RUE-RUE.  LLE-LUE 4/5.  Here is no pronator drift. There are no fasciculations noted. DTR's: Deep tendon reflexes are 2/4 bilaterally. Gait and Station: The patient is able to ambulate without difficulty. Gait is cautious and narrow.  Stride length is normal. ***      Thank you for allowing us  the opportunity to participate in the care of this nice patient. Please do not hesitate to contact us  for any questions or concerns.   Total time spent on today's visit was *** minutes dedicated to this patient today, preparing to see patient, examining the patient, ordering tests and/or medications and counseling the patient, documenting clinical information in the EHR or other health record, independently interpreting results and communicating results to the patient/family, discussing treatment and goals, answering patient's questions and coordinating care.  Cc:  Orpha Yancey LABOR, MD  Camie Sevin 07/06/2024 6:44 AM       [1]  Allergies Allergen Reactions   Penicillins Swelling and Rash   Sulfa Antibiotics Rash   "

## 2024-07-07 ENCOUNTER — Ambulatory Visit

## 2024-07-07 ENCOUNTER — Encounter: Payer: Self-pay | Admitting: Physician Assistant

## 2024-07-07 ENCOUNTER — Ambulatory Visit: Admitting: Physician Assistant
